# Patient Record
Sex: Male | Born: 2000 | Race: Black or African American | Hispanic: No | Marital: Single | Smoking: Never smoker
Health system: Southern US, Community
[De-identification: ages and names within clinical notes are randomized; demographics above are authoritative.]

## PROBLEM LIST (undated history)

## (undated) ENCOUNTER — Ambulatory Visit (HOSPITAL_COMMUNITY)
Admission: RE | Disposition: A | Discharge status: Still In Hospital | Payer: BLUE CROSS/BLUE SHIELD | Source: Ambulatory Visit

## (undated) DIAGNOSIS — I10 Essential (primary) hypertension: Secondary | ICD-10-CM

## (undated) DIAGNOSIS — E109 Type 1 diabetes mellitus without complications: Secondary | ICD-10-CM

---
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
## 2018-01-01 LAB — URINE CULTURE: Culture: NO GROWTH

## 2018-01-05 ENCOUNTER — Encounter (HOSPITAL_COMMUNITY): Payer: Self-pay

## 2018-01-05 ENCOUNTER — Ambulatory Visit (HOSPITAL_COMMUNITY)
Admission: EM | Admit: 2018-01-05 | Discharge: 2018-01-05 | Disposition: A | Discharge status: Home / Self Care | Payer: BLUE CROSS/BLUE SHIELD | Attending: Emergency Medicine | Admitting: Emergency Medicine

## 2018-01-05 DIAGNOSIS — I10 Essential (primary) hypertension: Secondary | ICD-10-CM

## 2018-01-05 DIAGNOSIS — Z113 Encounter for screening for infections with a predominantly sexual mode of transmission: Secondary | ICD-10-CM

## 2018-01-05 DIAGNOSIS — Z202 Contact with and (suspected) exposure to infections with a predominantly sexual mode of transmission: Secondary | ICD-10-CM | POA: Diagnosis not present

## 2018-01-05 DIAGNOSIS — R300 Dysuria: Secondary | ICD-10-CM | POA: Insufficient documentation

## 2018-01-05 DIAGNOSIS — Z711 Person with feared health complaint in whom no diagnosis is made: Secondary | ICD-10-CM | POA: Diagnosis present

## 2018-01-05 MED ORDER — CEFTRIAXONE SODIUM 250 MG IJ SOLR
INTRAMUSCULAR | Status: AC
  Filled 2018-01-05: qty 250

## 2018-01-05 MED ORDER — CEFTRIAXONE SODIUM 250 MG IJ SOLR
250.0000 mg | Freq: Once | INTRAMUSCULAR | Status: AC
  Administered 2018-01-05: 250 mg via INTRAMUSCULAR

## 2018-01-05 MED ORDER — AZITHROMYCIN 250 MG PO TABS
ORAL_TABLET | ORAL | Status: AC
  Filled 2018-01-05: qty 4

## 2018-01-05 MED ORDER — LIDOCAINE HCL (PF) 1 % IJ SOLN
INTRAMUSCULAR | Status: AC
  Filled 2018-01-05: qty 2

## 2018-01-05 MED ORDER — AZITHROMYCIN 250 MG PO TABS
1000.0000 mg | ORAL_TABLET | Freq: Once | ORAL | Status: AC
  Administered 2018-01-05: 1000 mg via ORAL

## 2018-01-05 NOTE — ED Provider Notes (Signed)
[facility]   [id number] 01/05/18 Arrival Time: 1623   CC: Dysuria  SUBJECTIVE:  Glen Merritt is a 18 y.o. male hx significant for DM type 1, HTN, who presents requesting STI screening.  Patient complains of dysuria x 1 weeks.  Last unprotected sexual encounter 1 month ago.  Has been sexually active with 2 male partners within the last 6 months.  Denies similar symptoms in the past.  Denies fever, chills, nausea, vomiting, abdominal or pelvic pain, penile rashes or lesions, testicular swelling or pain.      No LMP for male patient.  ROS: As per HPI.  Past Medical History:  Diagnosis Date  . Hypertension   . Type 1 diabetes mellitus ([place])    Dx 10/2003.  On a pump in the past   History reviewed. No pertinent surgical history. No Known Allergies No current facility-administered medications on file prior to encounter.    Current Outpatient Medications on File Prior to Encounter  Medication Sig Dispense Refill  . Blood Glucose Monitoring Suppl (ACCU-CHEK GUIDE) w/Device KIT 1 Device by Does not apply route daily as needed. 1 kit 0  . Continuous Blood Gluc Receiver (FREESTYLE LIBRE 14 DAY READER) DEVI 1 DEVICE BY DOES NOT APPLY ROUTE AS NEEDED. 1 Device 0  . Continuous Blood Gluc Sensor (FREESTYLE LIBRE SENSOR SYSTEM) MISC 1 Device by Does not apply route every 14 (fourteen) days. 6 each 3  . glucagon 1 MG injection Use for Severe Hypoglycemia . Inject 6m intramuscularly if unresponsive, unable to swallow, unconscious and/or has seizure. 2 kit 2  . glucose blood (ACCU-CHEK GUIDE) test strip Used to check blood sugars four times daily. 200 each 11  . glucose blood (CONTOUR NEXT TEST) test strip TEST BLOOD SUGAR 4 TIMES DAILY 360 each 3  . insulin aspart (NOVOLOG FLEXPEN) 100 UNIT/ML FlexPen As directed, total of 50 units per day, and pen needles 4/day 35 pen 11  . Insulin Glargine (LANTUS SOLOSTAR) 100 UNIT/ML Solostar Pen Inject 80 Units into the skin every morning. 10  pen 6  . Lancets Misc. (ACCU-CHEK MULTICLIX LANCET DEV) KIT Used to check blood sugars four times daily. 200 each 11  . lisinopril (PRINIVIL,ZESTRIL) 5 MG tablet TAKE 1 TABLET (5 MG TOTAL) BY MOUTH DAILY. 90 tablet 3   Social History   Socioeconomic History  . Marital status: Single    Spouse name: Not on file  . Number of children: Not on file  . Years of education: Not on file  . Highest education level: Not on file  Occupational History  . Not on file  Social Needs  . Financial resource strain: Not on file  . Food insecurity:    Worry: Not on file    Inability: Not on file  . Transportation needs:    Medical: Not on file    Non-medical: Not on file  Tobacco Use  . Smoking status: Never Smoker  . Smokeless tobacco: Never Used  Substance and Sexual Activity  . Alcohol use: No  . Drug use: Not on file  . Sexual activity: Not on file  Lifestyle  . Physical activity:    Days per week: Not on file    Minutes per session: Not on file  . Stress: Not on file  Relationships  . Social connections:    Talks on phone: Not on file    Gets together: Not on file    Attends religious service: Not on file    Active member of  club or organization: Not on file    Attends meetings of clubs or organizations: Not on file    Relationship status: Not on file  . Intimate partner violence:    Fear of current or ex partner: Not on file    Emotionally abused: Not on file    Physically abused: Not on file    Forced sexual activity: Not on file  Other Topics Concern  . Not on file  Social History Narrative   Is in 11th grade at [employer]. Lives with Mother and 2 brothers - ages 70,20; [place] pigs in the house; No smokers in the house.   Family History  Problem Relation Age of Onset  . Heart disease Maternal Grandmother   . Heart disease Maternal Grandfather   . Cancer Maternal Grandfather   . Obesity Mother   . Diabetes Mother   . Hypertension Mother     OBJECTIVE:  Vitals:   01/05/18  1653  BP: (!) 141/88  Pulse: (!) 122  Resp: 20  Temp: 98.4 F (36.9 C)  TempSrc: Oral  SpO2: 98%     General appearance: alert, NAD, appears stated age Head: NCAT Throat: lips, mucosa, and tongue normal; teeth and gums normal Lungs: CTA bilaterally without adventitious breath sounds Heart: regular rate and rhythm.  Radial pulses 2+ symmetrical bilaterally Back: no CVA tenderness Abdomen: soft, non-tender; bowel sounds normal;  no guarding GU: deferred Skin: warm and dry Psychological:  Alert and cooperative. Anxious mood and affect.  LABS:  Results for orders placed or performed during the hospital encounter of 12/31/17  Urine culture  Result Value Ref Range   Specimen Description URINE, RANDOM    Special Requests NONE    Culture      NO GROWTH Performed at [facility], [place] [address] [place], [place] [address]    Report Status 01/01/2018 FINAL   POCT urinalysis dip (device)  Result Value Ref Range   Glucose, UA 500 (A) NEGATIVE mg/dL   Bilirubin Urine NEGATIVE NEGATIVE   Ketones, ur 80 (A) NEGATIVE mg/dL   Specific Gravity, Urine 1.010 1.005 - 1.030   Hgb urine dipstick TRACE (A) NEGATIVE   pH 6.0 5.0 - 8.0   Protein, ur NEGATIVE NEGATIVE mg/dL   Urobilinogen, UA 0.2 0.0 - 1.0 mg/dL   Nitrite NEGATIVE NEGATIVE   Leukocytes, UA TRACE (A) NEGATIVE  I-STAT, chem 8  Result Value Ref Range   Sodium 135 135 - 145 mmol/L   Potassium 3.6 3.5 - 5.1 mmol/L   Chloride 102 98 - 111 mmol/L   BUN 16 6 - 20 mg/dL   Creatinine, Ser 0.60 (L) 0.61 - 1.24 mg/dL   Glucose, Bld 228 (H) 70 - 99 mg/dL   Calcium, Ion 1.20 1.15 - 1.40 mmol/L   TCO2 18 (L) 22 - 32 mmol/L   Hemoglobin 16.7 13.0 - 17.0 g/dL   HCT 49.0 39.0 - 52.0 %  Infectious mono screen, POC  Result Value Ref Range   Mono Screen NEGATIVE NEGATIVE    Labs Reviewed  URINE CYTOLOGY ANCILLARY ONLY    ASSESSMENT & PLAN:  1. Dysuria   2. Concern about STD in male without diagnosis     Meds ordered  this encounter  Medications  . cefTRIAXone (ROCEPHIN) injection 250 mg  . azithromycin (ZITHROMAX) tablet 1,000 mg    Pending: Labs Reviewed  URINE CYTOLOGY ANCILLARY ONLY    Given rocephin 257m injection and azithromycin 1g in office Urine cytology obtained  HIV/  syphilis testing today We will follow up with you regarding the results of your test If tests are positive, please abstain from sexual activity for at least 7 days and notify partners Follow up with PCP if symptoms persist Return here or go to ER if you have any new or worsening symptoms fever, chills, nausea, vomiting, abdominal pain, etc...  Reviewed expectations re: course of current medical issues. Questions answered. Outlined signs and symptoms indicating need for more acute intervention. Patient verbalized understanding. After Visit Summary given.       Lestine Box, PA-C 01/05/18 1958

## 2018-01-05 NOTE — Discharge Instructions (Addendum)
Given rocephin 250mg  injection and azithromycin 1g in office Urine cytology obtained  HIV/ syphilis testing today We will follow up with you regarding the results of your test If tests are positive, please abstain from sexual activity for at least 7 days and notify partners Follow up with PCP if symptoms persist Return here or go to ER if you have any new or worsening symptoms fever, chills, nausea, vomiting, abdominal pain, etc..Marland Kitchen

## 2018-01-05 NOTE — ED Triage Notes (Signed)
Pt presents to get STD Testing today.

## 2018-01-06 LAB — URINE CYTOLOGY ANCILLARY ONLY
CHLAMYDIA, DNA PROBE: POSITIVE — AB
Neisseria Gonorrhea: POSITIVE — AB
Trichomonas: NEGATIVE

## 2018-01-06 LAB — HIV ANTIBODY (ROUTINE TESTING W REFLEX): HIV Screen 4th Generation wRfx: NONREACTIVE

## 2018-01-06 LAB — RPR: RPR Ser Ql: NONREACTIVE

## 2018-01-08 ENCOUNTER — Telehealth (HOSPITAL_COMMUNITY): Payer: Self-pay | Admitting: Emergency Medicine

## 2018-01-08 ENCOUNTER — Encounter (HOSPITAL_COMMUNITY): Payer: Self-pay

## 2018-01-08 NOTE — Telephone Encounter (Signed)
Chlamydia is positive.  This was treated at the urgent care visit with po zithromax 1g.  Pt needs education to please refrain from sexual intercourse for 7 days to give the medicine time to work.  Sexual partners need to be notified and tested/treated.  Condoms may reduce risk of reinfection.  Recheck or followup with PCP for further evaluation if symptoms are not improving.  [facility] notified.  Test for gonorrhea was positive. This was treated at the urgent care visit with IM rocephin 250mg and po zithromax 1g. Pt needs education to refrain from sexual intercourse for 7 days after treatment to give the medicine time to work. Sexual partners need to be notified and tested/treated. Condoms may reduce risk of reinfection. Recheck or followup with PCP for further evaluation if symptoms are not improving. [facility] notified.   Attempted to reach patient. No answer at this time. Voicemail left.    

## 2018-01-12 ENCOUNTER — Telehealth (HOSPITAL_COMMUNITY): Payer: Self-pay | Admitting: Emergency Medicine

## 2018-01-12 NOTE — Telephone Encounter (Signed)
Patient contacted and made aware, all questions answered.   

## 2018-06-08 ENCOUNTER — Other Ambulatory Visit: Payer: Self-pay | Admitting: Endocrinology

## 2018-06-08 DIAGNOSIS — IMO0001 Reserved for inherently not codable concepts without codable children: Secondary | ICD-10-CM

## 2018-06-08 NOTE — Telephone Encounter (Signed)
Please refill x 1 Further refills would have to be considered by new PCP  

## 2018-06-14 ENCOUNTER — Other Ambulatory Visit: Payer: Self-pay | Admitting: Endocrinology

## 2018-06-14 DIAGNOSIS — E10649 Type 1 diabetes mellitus with hypoglycemia without coma: Secondary | ICD-10-CM

## 2018-06-16 NOTE — Telephone Encounter (Signed)
Please refill x 1 F/u is due  

## 2018-06-17 ENCOUNTER — Other Ambulatory Visit: Payer: Self-pay

## 2018-06-17 DIAGNOSIS — E1010 Type 1 diabetes mellitus with ketoacidosis without coma: Secondary | ICD-10-CM

## 2018-06-17 MED ORDER — INSULIN PEN NEEDLE 31G X 8 MM MISC: 1.0000 | Freq: Two times a day (BID) | 0 refills | Status: DC

## 2018-07-11 ENCOUNTER — Other Ambulatory Visit: Payer: Self-pay

## 2018-07-11 ENCOUNTER — Encounter: Payer: Self-pay | Admitting: Endocrinology

## 2018-07-11 ENCOUNTER — Ambulatory Visit (INDEPENDENT_AMBULATORY_CARE_PROVIDER_SITE_OTHER): Payer: BC Managed Care – PPO | Admitting: Endocrinology

## 2018-07-11 VITALS — BP 144/88 | HR 89 | Ht 68.5 in | Wt 171.8 lb

## 2018-07-11 DIAGNOSIS — E109 Type 1 diabetes mellitus without complications: Secondary | ICD-10-CM

## 2018-07-11 LAB — POCT GLYCOSYLATED HEMOGLOBIN (HGB A1C): Hemoglobin A1C: 9.6 % — AB (ref 4.0–5.6)

## 2018-07-11 MED ORDER — ACCU-CHEK GUIDE W/DEVICE KIT: 1.0000 | PACK | Freq: Four times a day (QID) | 0 refills | Status: DC

## 2018-07-11 MED ORDER — TRESIBA FLEXTOUCH 200 UNIT/ML ~~LOC~~ SOPN: 80.0000 [IU] | PEN_INJECTOR | Freq: Every day | SUBCUTANEOUS | 3 refills | Status: DC

## 2018-07-11 NOTE — Patient Instructions (Addendum)
Your blood pressure is high today.  Please see a primary care provider soon, to have it rechecked check your blood sugar 4 times a day: before the 3 meals, and at bedtime.  also check if you have symptoms of your blood sugar being too high or too low.  please keep a record of the readings and bring it to your next appointment here (or you can bring the meter itself).  You can write it on any piece of paper.  please call us sooner if your blood sugar goes below 70, or if you have a lot of readings over 200. Please change the lantus to "Tresiba," 80 units per day.  This is a very slow-acting insulin.  Therefore, if you miss it, you can take it when you are able.  Please continue the same as-needed novolog (however, I hope you will need less on the [place]).   Please see Vaughan Basta, to consider a pump. Please come back for a follow-up appointment in 2 months.

## 2018-07-11 NOTE — Progress Notes (Signed)
Subjective:    Patient ID: Glen Merritt, male    DOB: July 27, 2000, 18 y.o.   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  Interval history: Pt says he sometimes misses the insulin, as he is very busy at work.  He has mild hypoglycemia at least once per day. This happens with activity, or with missing a meal.  Otherwise, pt states he feels well in general.  He averages 80-90 units of Novolog per day.  He wants to consider a pump.   Past Medical History:  Diagnosis Date  . Hypertension   . Type 1 diabetes mellitus ([place])    Dx 10/2003.  On a pump in the past    No past surgical history on file.  Social History   Socioeconomic History  . Marital status: Single    Spouse name: Not on file  . Number of children: Not on file  . Years of education: Not on file  . Highest education level: Not on file  Occupational History  . Not on file  Social Needs  . Financial resource strain: Not on file  . Food insecurity    Worry: Not on file    Inability: Not on file  . Transportation needs    Medical: Not on file    Non-medical: Not on file  Tobacco Use  . Smoking status: Never Smoker  . Smokeless tobacco: Never Used  Substance and Sexual Activity  . Alcohol use: No  . Drug use: Not on file  . Sexual activity: Not on file  Lifestyle  . Physical activity    Days per week: Not on file    Minutes per session: Not on file  . Stress: Not on file  Relationships  . Social Herbalist on phone: Not on file    Gets together: Not on file    Attends religious service: Not on file    Active member of  club or organization: Not on file    Attends meetings of clubs or organizations: Not on file    Relationship status: Not on file  . Intimate partner violence    Fear of current or ex partner: Not on file    Emotionally abused: Not on file    Physically abused: Not on file    Forced sexual activity: Not on file  Other Topics Concern  . Not on file  Social History Narrative   Is in 11th grade at [employer]. Lives with Mother and 2 brothers - ages 40,20; [place] pigs in the house; No smokers in the house.    Current Outpatient Medications on File Prior to Visit  Medication Sig Dispense Refill  . glucagon 1 MG injection Use for Severe Hypoglycemia . Inject 17m intramuscularly if unresponsive, unable to swallow, unconscious and/or has seizure. 2 kit 2  . glucose blood (ACCU-CHEK GUIDE) test strip Used to check blood sugars four times daily. 200 each 11  . insulin aspart (NOVOLOG FLEXPEN) 100 UNIT/ML FlexPen Inject 100 units per day 30 mL 0  . Insulin Pen Needle 31G X 8 MM MISC 1 each by Does not apply route  2 (two) times a day. 90 each 0  . Lancets Misc. (ACCU-CHEK MULTICLIX LANCET DEV) KIT Used to check blood sugars four times daily. 200 each 11  . lisinopril (ZESTRIL) 5 MG tablet TAKE 1 TABLET BY MOUTH EVERY DAY **All refills should come from PCP.** 90 tablet 0   No current facility-administered medications on file prior to visit.     No Known Allergies  Family History  Problem Relation Age of Onset  . Heart disease Maternal Grandmother   . Heart disease Maternal Grandfather   . Cancer Maternal Grandfather   . Obesity Mother   . Diabetes Mother   . Hypertension Mother     BP (!) 144/88 (BP Location: Left Arm, Patient Position: Sitting, Cuff Size: Normal)   Pulse 89   Ht 5' 8.5" (1.74 m)   Wt 171 lb 12.8 oz (77.9 kg)   SpO2 96%   BMI 25.74 kg/m    Review of Systems He denies LOC.      Objective:   Physical Exam VITAL SIGNS:  See vs page GENERAL: no distress Pulses: dorsalis  pedis intact bilat.   MSK: no deformity of the feet CV: no leg edema Skin:  no ulcer on the feet.  normal color and temp on the feet. Neuro: sensation is intact to touch on the feet   Lab Results  Component Value Date   HGBA1C 9.6 (A) 07/11/2018       Assessment & Plan:  Type 1 DM: worse.  Hypoglycemia: this limits aggressiveness of glycemic control Noncompliance with insulin: tresiba offers dosing flexibility. HTN: is noted today.    Patient Instructions  Your blood pressure is high today.  Please see a primary care provider soon, to have it rechecked check your blood sugar 4 times a day: before the 3 meals, and at bedtime.  also check if you have symptoms of your blood sugar being too high or too low.  please keep a record of the readings and bring it to your next appointment here (or you can bring the meter itself).  You can write it on any piece of paper.  please call us sooner if your blood sugar goes below 70, or if you have a lot of readings over 200. Please change the lantus to "Tresiba," 80 units per day.  This is a very slow-acting insulin.  Therefore, if you miss it, you can take it when you are able.  Please continue the same as-needed novolog (however, I hope you will need less on the [place]).   Please see Vaughan Basta, to consider a pump. Please come back for a follow-up appointment in 2 months.

## 2018-07-14 ENCOUNTER — Other Ambulatory Visit: Payer: Self-pay | Admitting: Endocrinology

## 2018-07-14 DIAGNOSIS — E10649 Type 1 diabetes mellitus with hypoglycemia without coma: Secondary | ICD-10-CM

## 2018-07-14 NOTE — Telephone Encounter (Signed)
Patients mom called to inform he is completely out of this medication.

## 2018-07-17 ENCOUNTER — Other Ambulatory Visit: Payer: Self-pay | Admitting: Endocrinology

## 2018-07-17 ENCOUNTER — Telehealth: Payer: Self-pay | Admitting: Endocrinology

## 2018-07-17 DIAGNOSIS — E10649 Type 1 diabetes mellitus with hypoglycemia without coma: Secondary | ICD-10-CM

## 2018-07-17 NOTE — Telephone Encounter (Signed)
Patient is requesting that we switch his 30 day supply of medication to 90 days because it is covered.  Please Advise,Thanks

## 2018-07-18 ENCOUNTER — Other Ambulatory Visit: Payer: Self-pay

## 2018-07-18 DIAGNOSIS — E1010 Type 1 diabetes mellitus with ketoacidosis without coma: Secondary | ICD-10-CM

## 2018-07-18 DIAGNOSIS — E10649 Type 1 diabetes mellitus with hypoglycemia without coma: Secondary | ICD-10-CM

## 2018-07-18 MED ORDER — TRESIBA FLEXTOUCH 200 UNIT/ML ~~LOC~~ SOPN: 80.0000 [IU] | PEN_INJECTOR | Freq: Every day | SUBCUTANEOUS | 99 refills | Status: DC

## 2018-07-18 MED ORDER — INSULIN PEN NEEDLE 31G X 8 MM MISC: 1.0000 | Freq: Two times a day (BID) | 99 refills | Status: DC

## 2018-07-18 MED ORDER — NOVOLOG FLEXPEN 100 UNIT/ML ~~LOC~~ SOPN: PEN_INJECTOR | SUBCUTANEOUS | 99 refills | Status: DC

## 2018-07-18 NOTE — Telephone Encounter (Signed)
Please advise 

## 2018-07-18 NOTE — Telephone Encounter (Signed)
All Rx's sent per Dr. Cordelia Pen orders

## 2018-07-18 NOTE — Telephone Encounter (Signed)
Please refill both insulins x 90 days, with PRN refills.

## 2018-08-31 ENCOUNTER — Other Ambulatory Visit: Payer: Self-pay | Admitting: Endocrinology

## 2018-08-31 DIAGNOSIS — IMO0001 Reserved for inherently not codable concepts without codable children: Secondary | ICD-10-CM

## 2018-08-31 NOTE — Telephone Encounter (Signed)
Please refill x 1 month. Further refills would have to be considered by a new PCP

## 2018-09-12 ENCOUNTER — Ambulatory Visit (INDEPENDENT_AMBULATORY_CARE_PROVIDER_SITE_OTHER): Payer: BC Managed Care – PPO | Admitting: Endocrinology

## 2018-09-12 ENCOUNTER — Encounter: Payer: Self-pay | Admitting: Endocrinology

## 2018-09-12 ENCOUNTER — Other Ambulatory Visit: Payer: Self-pay

## 2018-09-12 VITALS — BP 132/70 | HR 85 | Ht 68.5 in | Wt 177.0 lb

## 2018-09-12 DIAGNOSIS — E10649 Type 1 diabetes mellitus with hypoglycemia without coma: Secondary | ICD-10-CM

## 2018-09-12 DIAGNOSIS — E1010 Type 1 diabetes mellitus with ketoacidosis without coma: Secondary | ICD-10-CM

## 2018-09-12 LAB — POCT GLYCOSYLATED HEMOGLOBIN (HGB A1C): Hemoglobin A1C: 10.1 % — AB (ref 4.0–5.6)

## 2018-09-12 NOTE — Patient Instructions (Addendum)
check your blood sugar 4 times a day: before the 3 meals, and at bedtime.  also check if you have symptoms of your blood sugar being too high or too low.  please keep a record of the readings and bring it to your next appointment here (or you can bring the meter itself).  You can write it on any piece of paper.  please call us sooner if your blood sugar goes below 70, or if you have a lot of readings over 200.  A different type of diabetes blood test is requested for you today.  We'll let you know about the result. If it is high, we'll increase the [place], to reduce you need for the Novolog.  I'll also refill it x 3 months.  I'll also prescribe for you a new meter.  Please come back for a follow-up appointment in 2 months.

## 2018-09-12 NOTE — Progress Notes (Signed)
Subjective:    Patient ID: Glen Merritt, male    DOB: 07-31-2000, 18 y.o.   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   Interval history: Pt says he does not miss the [place].  He has mild hypoglycemia approx once per day.  no cbg record, but states cbg's vary from 90-140.  He averages approx 50 units of novolog per day.   Past Medical History:  Diagnosis Date  . Hypertension   . Type 1 diabetes mellitus ([place])    Dx 10/2003.  On a pump in the past    No past surgical history on file.  Social History   Socioeconomic History  . Marital status: Single    Spouse name: Not on file  . Number of children: Not on file  . Years of education: Not on file  . Highest education level: Not on file  Occupational History  . Not on file  Social Needs  . Financial resource strain: Not on file  . Food insecurity    Worry: Not on file    Inability: Not on file  . Transportation needs    Medical: Not on file    Non-medical: Not on file  Tobacco Use  . Smoking status: Never Smoker  . Smokeless tobacco: Never Used  Substance and Sexual Activity  . Alcohol use: No  . Drug use: Not on file  . Sexual activity: Not on file  Lifestyle  . Physical activity    Days per week: Not on file    Minutes per session: Not on file  . Stress: Not on file  Relationships  . Social Herbalist on phone: Not on file    Gets together: Not on file    Attends religious service: Not on file    Active member of club or organization: Not on file    Attends  meetings of clubs or organizations: Not on file    Relationship status: Not on file  . Intimate partner violence    Fear of current or ex partner: Not on file    Emotionally abused: Not on file    Physically abused: Not on file    Forced sexual activity: Not on file  Other Topics Concern  . Not on file  Social History Narrative   Is in 11th grade at [employer]. Lives with Mother and 2 brothers - ages 60,20; [place] pigs in the house; No smokers in the house.    Current Outpatient Medications on File Prior to Visit  Medication Sig Dispense Refill  . Blood Glucose Monitoring Suppl (ACCU-CHEK GUIDE) w/Device KIT 1 Device by Does not apply route 4 (four) times daily. 1 kit 0  . glucagon 1 MG injection Use for Severe Hypoglycemia . Inject 40m intramuscularly if unresponsive, unable to swallow, unconscious and/or has seizure. 2 kit 2  . glucose blood (ACCU-CHEK GUIDE) test strip Used to check blood sugars four times daily. 200 each 11  . insulin aspart (NOVOLOG FLEXPEN) 100 UNIT/ML FlexPen Inject 100 units in the skin once per day  90 mL prn  . Insulin Degludec (TRESIBA FLEXTOUCH) 200 UNIT/ML SOPN Inject 80 Units into the skin daily. 72 mL prn  . Insulin Pen Needle 31G X 8 MM MISC 1 each by Does not apply route 2 (two) times a day. 1000 each prn  . Lancets Misc. (ACCU-CHEK MULTICLIX LANCET DEV) KIT Used to check blood sugars four times daily. 200 each 11  . lisinopril (ZESTRIL) 5 MG tablet TAKE 1 TABLET BY MOUTH EVERY DAY **ALL REFILLS SHOULD COME FROM PCP.** 30 tablet 0   No current facility-administered medications on file prior to visit.     No Known Allergies  Family History  Problem Relation Age of Onset  . Heart disease Maternal Grandmother   . Heart disease Maternal Grandfather   . Cancer Maternal Grandfather   . Obesity Mother   . Diabetes Mother   . Hypertension Mother     BP 132/70 (BP Location: Left Arm, Patient Position: Sitting, Cuff Size: Normal)   Pulse 85   Ht 5' 8.5" (1.74  m)   Wt 177 lb (80.3 kg)   SpO2 98%   BMI 26.52 kg/m    Review of Systems Denies LOC.     Objective:   Physical Exam VITAL SIGNS:  See vs page GENERAL: no distress Pulses: dorsalis pedis intact bilat.   MSK: no deformity of the feet CV: no leg edema Skin:  no ulcer on the feet.  normal color and temp on the feet. Neuro: sensation is intact to touch on the feet.    A1c=10.1%      Assessment & Plan:  Type 1 DM: worse.  In particular, a1c is much worse than would be predicted by cbg's.   Hypoglycemia: this limits aggressiveness of glycemic control  Patient Instructions  check your blood sugar 4 times a day: before the 3 meals, and at bedtime.  also check if you have symptoms of your blood sugar being too high or too low.  please keep a record of the readings and bring it to your next appointment here (or you can bring the meter itself).  You can write it on any piece of paper.  please call us sooner if your blood sugar goes below 70, or if you have a lot of readings over 200.  A different type of diabetes blood test is requested for you today.  We'll let you know about the result. If it is high, we'll increase the [place], to reduce you need for the Novolog.  I'll also refill it x 3 months.  I'll also prescribe for you a new meter.  Please come back for a follow-up appointment in 2 months.

## 2018-09-28 ENCOUNTER — Other Ambulatory Visit: Payer: Self-pay | Admitting: Endocrinology

## 2018-09-28 DIAGNOSIS — E1010 Type 1 diabetes mellitus with ketoacidosis without coma: Secondary | ICD-10-CM

## 2018-10-01 ENCOUNTER — Other Ambulatory Visit: Payer: Self-pay | Admitting: Endocrinology

## 2018-10-01 DIAGNOSIS — IMO0001 Reserved for inherently not codable concepts without codable children: Secondary | ICD-10-CM

## 2018-10-01 NOTE — Telephone Encounter (Signed)
Please refill x 3 months Further refills would have to be considered by new PCP   

## 2018-10-01 NOTE — Telephone Encounter (Signed)
Please advise 

## 2018-11-14 ENCOUNTER — Other Ambulatory Visit: Payer: Self-pay

## 2018-11-14 ENCOUNTER — Ambulatory Visit (INDEPENDENT_AMBULATORY_CARE_PROVIDER_SITE_OTHER): Payer: BC Managed Care – PPO | Admitting: Endocrinology

## 2018-11-14 ENCOUNTER — Encounter: Payer: Self-pay | Admitting: Endocrinology

## 2018-11-14 VITALS — BP 130/62 | HR 94 | Ht 68.5 in | Wt 171.0 lb

## 2018-11-14 DIAGNOSIS — E1010 Type 1 diabetes mellitus with ketoacidosis without coma: Secondary | ICD-10-CM

## 2018-11-14 DIAGNOSIS — R253 Fasciculation: Secondary | ICD-10-CM | POA: Diagnosis not present

## 2018-11-14 DIAGNOSIS — E109 Type 1 diabetes mellitus without complications: Secondary | ICD-10-CM | POA: Diagnosis not present

## 2018-11-14 LAB — POCT GLYCOSYLATED HEMOGLOBIN (HGB A1C): Hemoglobin A1C: 11.4 % — AB (ref 4.0–5.6)

## 2018-11-14 LAB — BASIC METABOLIC PANEL
BUN: 9 mg/dL (ref 6–23)
CO2: 26 mEq/L (ref 19–32)
Calcium: 9.2 mg/dL (ref 8.4–10.5)
Chloride: 102 mEq/L (ref 96–112)
Creatinine, Ser: 0.8 mg/dL (ref 0.40–1.50)
GFR: 148.24 mL/min (ref >60.00)
Glucose, Bld: 313 mg/dL — ABNORMAL HIGH (ref 70–99)
Potassium: 4.5 mEq/L (ref 3.5–5.1)
Sodium: 135 mEq/L (ref 135–145)

## 2018-11-14 LAB — TSH: TSH: 1.61 u[IU]/mL (ref 0.35–5.50)

## 2018-11-14 NOTE — Progress Notes (Addendum)
Subjective:    Patient ID: Glen Merritt, male    DOB: 2000-10-21, 18 y.o.   MRN: [id number]  HPI Pt returns for f/u of diabetes mellitus: DM type: 1 Dx'ed: 2119 Complications: none Therapy: insulin since dx DKA: Last episode was in 2016 Severe hypoglycemia: Last episode was in 2010 Pancreatitis: never Pancreatic imaging: never.   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   Interval history: Pt says he does not miss the [place].  He takes 80 units qd.  He also takes Novolog, for a total of 30 units per day.  He has mild hypoglycemia approx QOD.  no cbg record, but states cbg's vary from 53-150.   Past Medical History:  Diagnosis Date  . Hypertension   . Type 1 diabetes mellitus ([place])    Dx 10/2003.  On a pump in the past    No past surgical history on file.  Social History   Socioeconomic History  . Marital status: Single    Spouse name: Not on file  . Number of children: Not on file  . Years of education: Not on file  . Highest education level: Not on file  Occupational History  . Not on file  Social Needs  . Financial resource strain: Not on file  . Food insecurity    Worry: Not on file    Inability: Not on file  . Transportation needs    Medical: Not on file    Non-medical: Not on file  Tobacco Use  . Smoking status: Never Smoker  . Smokeless tobacco: Never Used  Substance and Sexual Activity  . Alcohol use: No  . Drug use: Not on file  . Sexual activity: Not on file  Lifestyle  . Physical activity    Days per week: Not on file    Minutes per session: Not on file  . Stress: Not on file  Relationships  . Social Herbalist on phone: Not on file    Gets together: Not on file    Attends religious service: Not on file    Active member of club or organization: Not  on file    Attends meetings of clubs or organizations: Not on file    Relationship status: Not on file  . Intimate partner violence    Fear of current or ex partner: Not on file    Emotionally abused: Not on file    Physically abused: Not on file    Forced sexual activity: Not on file  Other Topics Concern  . Not on file  Social History Narrative   Is in 11th grade at [employer]. Lives with Mother and 2 brothers - ages 53,20; [place] pigs in the house; No smokers in the house.    Current Outpatient Medications on File Prior to Visit  Medication Sig Dispense Refill  . B-D ULTRAFINE III SHORT PEN 31G X 8 MM MISC USE AS DIRECTED TWICE A DAY 180 each 3  . Blood Glucose Monitoring Suppl (ACCU-CHEK GUIDE) w/Device KIT 1 Device by Does not apply route 4 (four) times daily. 1 kit 0  . glucagon 1 MG injection Use for Severe Hypoglycemia . Inject 90m intramuscularly if unresponsive, unable to swallow, unconscious and/or has seizure. 2 kit 2  . glucose blood (ACCU-CHEK GUIDE) test  strip Used to check blood sugars four times daily. 200 each 11  . insulin aspart (NOVOLOG FLEXPEN) 100 UNIT/ML FlexPen Inject 100 units in the skin once per day (Patient taking differently: Inject 30 Units into the skin daily. Inject 100 units in the skin once per day) 90 mL prn  . Insulin Degludec (TRESIBA FLEXTOUCH) 200 UNIT/ML SOPN Inject 80 Units into the skin daily. 72 mL prn  . Lancets Misc. (ACCU-CHEK MULTICLIX LANCET DEV) KIT Used to check blood sugars four times daily. 200 each 11  . lisinopril (ZESTRIL) 5 MG tablet Take 1 tablet (5 mg total) by mouth daily. SEND FUTURE REFILL REQUESTS TO NEW PCP. WILL ONLY PROVIDE 90 DAY SUPPLY 30 tablet 2   No current facility-administered medications on file prior to visit.     No Known Allergies  Family History  Problem Relation Age of Onset  . Heart disease Maternal Grandmother   . Heart disease Maternal Grandfather   . Cancer Maternal Grandfather   . Obesity Mother   .  Diabetes Mother   . Hypertension Mother     BP 130/62 (BP Location: Left Arm, Patient Position: Sitting, Cuff Size: Normal)   Pulse 94   Ht 5' 8.5" (1.74 m)   Wt 171 lb (77.6 kg)   SpO2 96%   BMI 25.62 kg/m    Review of Systems Denies LOC.  He reports intermittent twitching sensation of the neck and app 4 extremities.      Objective:   Physical Exam VITAL SIGNS:  See vs page.  GENERAL: no distress.  Pulses: dorsalis pedis intact bilat.   MSK: no deformity of the feet CV: no leg edema.  Skin:  no ulcer on the feet.  normal color and temp on the feet. Neuro: sensation is intact to touch on the feet.    Lab Results  Component Value Date   HGBA1C 11.4 (A) 11/14/2018       Assessment & Plan:  Type 1 DM: uncertain glycemic control Hypoglycemia: this limits aggressiveness of glycemic control' Discordance between cbg's and A1c.  check fructosamine. Twitching sensation: check labs for this, also.   Patient Instructions  check your blood sugar 4 times a day: before the 3 meals, and at bedtime.  also check if you have symptoms of your blood sugar being too high or too low.  please keep a record of the readings and bring it to your next appointment here (or you can bring the meter itself).  You can write it on any piece of paper.  please call us sooner if your blood sugar goes below 70, or if you have a lot of readings over 200.  A different type of diabetes blood test is requested for you today.  We'll let you know about the result.   If it is high, we'll increase the [place] again, to reduce you need for the Novolog.  I'll also refill it x 3 months.   To avoid the blood sugar being low in the middle of the night, you should eat a snack at bedtime.   Please come back for a follow-up appointment in 2 months.

## 2018-11-14 NOTE — Patient Instructions (Addendum)
check your blood sugar 4 times a day: before the 3 meals, and at bedtime.  also check if you have symptoms of your blood sugar being too high or too low.  please keep a record of the readings and bring it to your next appointment here (or you can bring the meter itself).  You can write it on any piece of paper.  please call us sooner if your blood sugar goes below 70, or if you have a lot of readings over 200.  A different type of diabetes blood test is requested for you today.  We'll let you know about the result.   If it is high, we'll increase the [place] again, to reduce you need for the Novolog.  I'll also refill it x 3 months.   To avoid the blood sugar being low in the middle of the night, you should eat a snack at bedtime.   Please come back for a follow-up appointment in 2 months.

## 2018-11-19 LAB — FRUCTOSAMINE: Fructosamine: 434 umol/L — ABNORMAL HIGH (ref 205–285)

## 2019-01-05 ENCOUNTER — Other Ambulatory Visit: Payer: Self-pay | Admitting: Endocrinology

## 2019-01-05 DIAGNOSIS — E1065 Type 1 diabetes mellitus with hyperglycemia: Secondary | ICD-10-CM

## 2019-01-05 NOTE — Telephone Encounter (Signed)
please contact patient: Who is your PCP.  If you don't have one, please set up appt. please refill x 1, pending that appt.

## 2019-01-05 NOTE — Telephone Encounter (Signed)
On 10/01/18 you requested Rx be refilled for a 90 day supply and pt to be advised to schedule with PCP. Do you still wish to refill x1?

## 2019-01-05 NOTE — Telephone Encounter (Signed)
Please advise 

## 2019-01-06 NOTE — Telephone Encounter (Signed)
We have tried refilling x 1, so the next step is to make sure he is getting set up with a primary care provider.

## 2019-01-16 ENCOUNTER — Encounter: Payer: Self-pay | Admitting: Endocrinology

## 2019-01-16 ENCOUNTER — Ambulatory Visit (INDEPENDENT_AMBULATORY_CARE_PROVIDER_SITE_OTHER): Payer: BC Managed Care – PPO | Admitting: Endocrinology

## 2019-01-16 ENCOUNTER — Other Ambulatory Visit: Payer: Self-pay

## 2019-01-16 VITALS — BP 104/64 | HR 95 | Ht 68.5 in | Wt 165.0 lb

## 2019-01-16 DIAGNOSIS — E1010 Type 1 diabetes mellitus with ketoacidosis without coma: Secondary | ICD-10-CM

## 2019-01-16 LAB — POCT GLYCOSYLATED HEMOGLOBIN (HGB A1C): Hemoglobin A1C: 11.7 % — AB (ref 4.0–5.6)

## 2019-01-16 MED ORDER — TRESIBA FLEXTOUCH 200 UNIT/ML ~~LOC~~ SOPN: 120.0000 [IU] | PEN_INJECTOR | Freq: Every day | SUBCUTANEOUS | 99 refills | Status: DC

## 2019-01-16 NOTE — Patient Instructions (Addendum)
check your blood sugar 4 times a day: before the 3 meals, and at bedtime.  also check if you have symptoms of your blood sugar being too high or too low.  please keep a record of the readings and bring it to your next appointment here (or you can bring the meter itself).  You can write it on any piece of paper.  please call us sooner if your blood sugar goes below 70, or if you have a lot of readings over 200.  I have sent a prescription to your pharmacy, to increase the Tresiba to 120 units daily.  This will help you further reduce the novolog.  On this type of insulin schedule, you should eat meals on a regular schedule.  If a meal is missed or significantly delayed, your blood sugar could go low.  To avoid the blood sugar being low in the middle of the night, you should eat a snack at bedtime.   Please come back for a follow-up appointment in 2 months.

## 2019-01-16 NOTE — Progress Notes (Signed)
Subjective:    Patient ID: Glen Merritt, male    DOB: Aug 19, 2000, 19 y.o.   MRN: [id number]  HPI Pt returns for f/u of diabetes mellitus: DM type: 1 Dx'ed: 4235 Complications: none Therapy: insulin since dx DKA: Last episode was in 2016 Severe hypoglycemia: Last episode was in 2010 Pancreatitis: never Pancreatic imaging: never.   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   SDOH: he was ref to consider pump, but he did not make appt; he eats 1 meal per day (supper).   Interval history: Pt says he never missed the [place].  He takes 100 units qd.  He also takes Novolog, for a total of 50 units per day.  He has mild hypoglycemia approx QOD.  no cbg record, but states cbg's vary from 60-500. It is in general lowest in the afternoon.  Past Medical History:  Diagnosis Date  . Hypertension   . Type 1 diabetes mellitus ([place])    Dx 10/2003.  On a pump in the past    No past surgical history on file.  Social History   Socioeconomic History  . Marital status: Single    Spouse name: Not on file  . Number of children: Not on file  . Years of education: Not on file  . Highest education level: Not on file  Occupational History  . Not on file  Tobacco Use  . Smoking status: Never Smoker  . Smokeless tobacco: Never Used  Substance and Sexual Activity  . Alcohol use: No  . Drug use: Not on file  . Sexual activity: Not on file  Other Topics Concern  . Not on file  Social History Narrative   Is in 11th grade at [employer]. Lives with Mother and 2 brothers - ages 72,20; [place] pigs in the house; No smokers in the house.   Social Determinants of Health   Financial Resource Strain:   . Difficulty of Paying Living Expenses: Not on file  Food Insecurity:   . Worried About Charity fundraiser in the Last Year: Not on file  . Ran Out of Food in the Last Year: Not  on file  Transportation Needs:   . Lack of Transportation (Medical): Not on file  . Lack of Transportation (Non-Medical): Not on file  Physical Activity:   . Days of Exercise per Week: Not on file  . Minutes of Exercise per Session: Not on file  Stress:   . Feeling of Stress : Not on file  Social Connections:   . Frequency of Communication with Friends and Family: Not on file  . Frequency of Social Gatherings with Friends and Family: Not on file  . Attends Religious Services: Not on file  . Active Member of Clubs or Organizations: Not on file  . Attends Archivist Meetings: Not on file  . Marital Status: Not on file  Intimate Partner Violence:   . Fear of Current or Ex-Partner: Not on file  . Emotionally Abused: Not on file  . Physically Abused: Not on file  . Sexually Abused: Not on file    Current Outpatient Medications on File Prior to Visit  Medication Sig Dispense Refill  . B-D ULTRAFINE III SHORT PEN 31G X 8 MM MISC USE AS DIRECTED TWICE A DAY 180 each 3  . Blood Glucose Monitoring Suppl (ACCU-CHEK GUIDE) w/Device KIT 1  Device by Does not apply route 4 (four) times daily. 1 kit 0  . glucagon 1 MG injection Use for Severe Hypoglycemia . Inject 17m intramuscularly if unresponsive, unable to swallow, unconscious and/or has seizure. 2 kit 2  . glucose blood (ACCU-CHEK GUIDE) test strip Used to check blood sugars four times daily. 200 each 11  . insulin aspart (NOVOLOG FLEXPEN) 100 UNIT/ML FlexPen Inject 100 units in the skin once per day (Patient taking differently: Inject 50 Units into the skin daily. Inject 50 units in the skin once per day) 90 mL prn  . Lancets Misc. (ACCU-CHEK MULTICLIX LANCET DEV) KIT Used to check blood sugars four times daily. 200 each 11  . lisinopril (ZESTRIL) 5 MG tablet Take 1 tablet (5 mg total) by mouth daily. SEND FUTURE REFILL REQUESTS TO NEW PCP. WILL ONLY PROVIDE 90 DAY SUPPLY 30 tablet 2   No current facility-administered medications on  file prior to visit.    No Known Allergies  Family History  Problem Relation Age of Onset  . Heart disease Maternal Grandmother   . Heart disease Maternal Grandfather   . Cancer Maternal Grandfather   . Obesity Mother   . Diabetes Mother   . Hypertension Mother     BP 104/64 (BP Location: Right Arm, Patient Position: Sitting, Cuff Size: Normal)   Pulse 95   Ht 5' 8.5" (1.74 m)   Wt 165 lb (74.8 kg)   SpO2 94%   BMI 24.72 kg/m    Review of Systems Denies LOC    Objective:   Physical Exam VITAL SIGNS:  See vs page.  GENERAL: no distress Pulses: dorsalis pedis intact bilat.   MSK: no deformity of the feet CV: no leg edema.  Skin:  no ulcer on the feet.  normal color and temp on the feet.  Neuro: sensation is intact to touch on the feet.   Lab Results  Component Value Date   HGBA1C 11.7 (A) 01/16/2019        Assessment & Plan:  Type 1 DM: worse Hypoglycemia: this limits aggressiveness of glycemic control.  We'll try increasing Tresiba, and decrease Novolog  Patient Instructions  check your blood sugar 4 times a day: before the 3 meals, and at bedtime.  also check if you have symptoms of your blood sugar being too high or too low.  please keep a record of the readings and bring it to your next appointment here (or you can bring the meter itself).  You can write it on any piece of paper.  please call [email or web address] if your blood sugar goes below 70, or if you have a lot of readings over 200.  I have sent a prescription to your pharmacy, to increase the Tresiba to 120 units daily.  This will help you further reduce the novolog.  On this type of insulin schedule, you should eat meals on a regular schedule.  If a meal is missed or significantly delayed, your blood sugar could go low.  To avoid the blood sugar being low in the middle of the night, you should eat a snack at bedtime.   Please come back for a follow-up appointment in 2 months.

## 2019-02-06 ENCOUNTER — Other Ambulatory Visit: Payer: Self-pay

## 2019-02-06 ENCOUNTER — Telehealth: Payer: Self-pay | Admitting: Endocrinology

## 2019-02-06 DIAGNOSIS — E1010 Type 1 diabetes mellitus with ketoacidosis without coma: Secondary | ICD-10-CM

## 2019-02-06 MED ORDER — TRESIBA FLEXTOUCH 200 UNIT/ML ~~LOC~~ SOPN: 120.0000 [IU] | PEN_INJECTOR | Freq: Every day | SUBCUTANEOUS | 0 refills | Status: DC

## 2019-02-06 NOTE — Telephone Encounter (Signed)
Outpatient Medication Detail   Disp Refills Start End   Insulin Degludec (TRESIBA FLEXTOUCH) 200 UNIT/ML SOPN 54 mL 0 02/06/2019    Sig - Route: Inject 120 Units into the skin daily. - Subcutaneous   Sent to pharmacy as: Insulin Degludec (TRESIBA FLEXTOUCH) 200 UNIT/ML Solution Pen-injector   E-Prescribing Status: Receipt confirmed by pharmacy (02/06/2019  3:37 PM EST)

## 2019-02-06 NOTE — Telephone Encounter (Signed)
MEDICATION: [place]  PH[facility]ACY:  [facility] [address]  IS THIS A 90 DAY SUPPLY : Would Like A 90 day  IS PATIENT OUT OF MEDICATION: YES  IF NOT; HOW MUCH IS LEFT:   LAST APPOINTMENT DATE: @1 /15/2021  NEXT APPOINTMENT DATE:@3 /12/2019  DO WE HAVE YOUR PERMISSION TO LEAVE A DETAILED MESSAGE: yes  OTHER COMMENTS: did not pick up medication on 01/16/2019 so he is out of medication at this time   **Let patient know to contact pharmacy at the end of the day to make sure medication is ready. **  ** Please notify patient to allow 48-72 hours to process**  **Encourage patient to contact the pharmacy for refills or they can request refills through [facility]**

## 2019-03-13 ENCOUNTER — Other Ambulatory Visit: Payer: Self-pay

## 2019-03-13 ENCOUNTER — Ambulatory Visit (INDEPENDENT_AMBULATORY_CARE_PROVIDER_SITE_OTHER): Payer: BC Managed Care – PPO | Admitting: Endocrinology

## 2019-03-13 ENCOUNTER — Encounter: Payer: Self-pay | Admitting: Endocrinology

## 2019-03-13 VITALS — BP 112/70 | HR 75 | Ht 68.5 in | Wt 163.4 lb

## 2019-03-13 DIAGNOSIS — E1010 Type 1 diabetes mellitus with ketoacidosis without coma: Secondary | ICD-10-CM

## 2019-03-13 LAB — POCT GLYCOSYLATED HEMOGLOBIN (HGB A1C): Hemoglobin A1C: 10.9 % — AB (ref 4.0–5.6)

## 2019-03-13 MED ORDER — TRESIBA FLEXTOUCH 200 UNIT/ML ~~LOC~~ SOPN: 140.0000 [IU] | PEN_INJECTOR | Freq: Every day | SUBCUTANEOUS | 3 refills | Status: DC

## 2019-03-13 NOTE — Progress Notes (Signed)
Subjective:    Patient ID: Glen Merritt, male    DOB: 2000/12/11, 19 y.o.   MRN: [id number]  HPI Pt returns for f/u of diabetes mellitus: DM type: 1 Dx'ed: 3545 Complications: none Therapy: insulin since dx DKA: Last episode was in 2016 Severe hypoglycemia: Last episode was in 2010 Pancreatitis: never Pancreatic imaging: never.   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    SDOH: he was ref to consider pump, but he did not make appt;  other: he eats 1 meal per day (supper).   Interval history: Pt says he never missed the [place].  He takes 120 units qd.  He also takes Novolog, for a total of 60-70 units per day.  He has mild hypoglycemia approx twice per week.  no cbg record, but states cbg's vary from 57-400. It is in general lowest in the afternoon.  Past Medical History:  Diagnosis Date  . Hypertension   . Type 1 diabetes mellitus ([place])    Dx 10/2003.  On a pump in the past    No past surgical history on file.  Social History   Socioeconomic History  . Marital status: Single    Spouse name: Not on file  . Number of children: Not on file  . Years of education: Not on file  . Highest education level: Not on file  Occupational History  . Not on file  Tobacco Use  . Smoking status: Never Smoker  . Smokeless tobacco: Never Used  Substance and Sexual Activity  . Alcohol use: No  . Drug use: Not on file  . Sexual activity: Not on file  Other Topics Concern  . Not on file  Social History Narrative   Is in 11th grade at [employer]. Lives with Mother and 2 brothers - ages 60,20; [place] pigs in the house; No smokers in the house.   Social Determinants of Health   Financial Resource Strain:   . Difficulty of Paying Living Expenses:   Food Insecurity:   . Worried About Charity fundraiser in the Last Year:   . Arboriculturist in the Last Year:     Transportation Needs:   . Film/video editor (Medical):   Marland Kitchen Lack of Transportation (Non-Medical):   Physical Activity:   . Days of Exercise per Week:   . Minutes of Exercise per Session:   Stress:   . Feeling of Stress :   Social Connections:   . Frequency of Communication with Friends and Family:   . Frequency of Social Gatherings with Friends and Family:   . Attends Religious Services:   . Active Member of Clubs or Organizations:   . Attends Archivist Meetings:   Marland Kitchen Marital Status:   Intimate Partner Violence:   . Fear of Current or Ex-Partner:   . Emotionally Abused:   Marland Kitchen Physically Abused:   . Sexually Abused:     Current Outpatient Medications on File Prior to Visit  Medication Sig Dispense Refill  . B-D ULTRAFINE III SHORT PEN 31G X 8 MM MISC USE AS DIRECTED TWICE A DAY 180 each 3  . Blood Glucose Monitoring Suppl (ACCU-CHEK GUIDE) w/Device KIT 1 Device by Does not apply route 4 (four) times daily. 1 kit 0  . glucagon 1 MG injection Use for Severe Hypoglycemia . Inject 19m intramuscularly if unresponsive, unable  to swallow, unconscious and/or has seizure. 2 kit 2  . glucose blood (ACCU-CHEK GUIDE) test strip Used to check blood sugars four times daily. 200 each 11  . insulin aspart (NOVOLOG FLEXPEN) 100 UNIT/ML FlexPen Inject 100 units in the skin once per day (Patient taking differently: Inject 80 Units into the skin daily. ) 90 mL prn  . Lancets Misc. (ACCU-CHEK MULTICLIX LANCET DEV) KIT Used to check blood sugars four times daily. 200 each 11  . lisinopril (ZESTRIL) 5 MG tablet Take 1 tablet (5 mg total) by mouth daily. SEND FUTURE REFILL REQUESTS TO NEW PCP. WILL ONLY PROVIDE 90 DAY SUPPLY 30 tablet 2   No current facility-administered medications on file prior to visit.    No Known Allergies  Family History  Problem Relation Age of Onset  . Heart disease Maternal Grandmother   . Heart disease Maternal Grandfather   . Cancer Maternal Grandfather   .  Obesity Mother   . Diabetes Mother   . Hypertension Mother     BP 112/70   Pulse 75   Ht 5' 8.5" (1.74 m)   Wt 163 lb 6.4 oz (74.1 kg)   SpO2 99%   BMI 24.48 kg/m    Review of Systems Denies LOC.      Objective:   Physical Exam VITAL SIGNS:  See vs page GENERAL: no distress Pulses: dorsalis pedis intact bilat.   MSK: no deformity of the feet CV: no leg edema Skin:  no ulcer on the feet.  normal color and temp on the feet. Neuro: sensation is intact to touch on the feet.     Lab Results  Component Value Date   HGBA1C 10.9 (A) 03/13/2019       Assessment & Plan:  Type 1 DM: he needs increased rx. We'll continue plan of emphasizing basal insulin.  Hypoglycemia: this limits aggressiveness of glycemic control, so we'll increase insulin just slightly.   Patient Instructions  check your blood sugar 4 times a day: before the 3 meals, and at bedtime.  also check if you have symptoms of your blood sugar being too high or too low.  please keep a record of the readings and bring it to your next appointment here (or you can bring the meter itself).  You can write it on any piece of paper.  please call us sooner if your blood sugar goes below 70, or if you have a lot of readings over 200.  I have sent a prescription to your pharmacy, to increase the Tresiba to 140 units daily.  This will help you reduce the novolog.  On this type of insulin schedule, you should eat meals on a regular schedule.  If a meal is missed or significantly delayed, your blood sugar could go low.  To avoid the blood sugar being low in the middle of the night, you should eat a snack at bedtime.   Please come back for a follow-up appointment in 2 months.

## 2019-03-13 NOTE — Patient Instructions (Addendum)
check your blood sugar 4 times a day: before the 3 meals, and at bedtime.  also check if you have symptoms of your blood sugar being too high or too low.  please keep a record of the readings and bring it to your next appointment here (or you can bring the meter itself).  You can write it on any piece of paper.  please call us sooner if your blood sugar goes below 70, or if you have a lot of readings over 200.  I have sent a prescription to your pharmacy, to increase the Tresiba to 140 units daily.  This will help you reduce the novolog.  On this type of insulin schedule, you should eat meals on a regular schedule.  If a meal is missed or significantly delayed, your blood sugar could go low.  To avoid the blood sugar being low in the middle of the night, you should eat a snack at bedtime.   Please come back for a follow-up appointment in 2 months.

## 2019-03-16 ENCOUNTER — Encounter: Payer: BC Managed Care – PPO | Admitting: Nutrition

## 2019-05-12 ENCOUNTER — Telehealth: Payer: Self-pay | Admitting: Endocrinology

## 2019-05-12 NOTE — Telephone Encounter (Signed)
Medication Refill Request  Did you call your pharmacy and request this refill first? Yes   . If patient has not contacted pharmacy first, instruct them to do so for future refills.  . Remind them that contacting the pharmacy for their refill is the quickest method to get the refill.  . Refill policy also stated that it will take anywhere between 24-72 hours to receive the refill.    Name of medication? lisinopril  Is this a 90 day supply? yes  Name and location of pharmacy?   [id number] [place], [place] - [address] AT [facility] [address] Phone:  [phone]  Fax:  [phone]

## 2019-05-12 NOTE — Telephone Encounter (Signed)
Please forward refill request to pt's primary care provider.   

## 2019-05-12 NOTE — Telephone Encounter (Signed)
Please advise 

## 2019-05-12 NOTE — Telephone Encounter (Signed)
Called pt and informed about Dr. George Hugh response below. States he does not have a PCP. Advised he will need to call and establish with a PCP as this is a medication he does not manage. Verbalized acceptance and understanding.

## 2019-05-15 ENCOUNTER — Other Ambulatory Visit: Payer: Self-pay

## 2019-05-15 ENCOUNTER — Ambulatory Visit (INDEPENDENT_AMBULATORY_CARE_PROVIDER_SITE_OTHER): Payer: BC Managed Care – PPO | Admitting: Endocrinology

## 2019-05-15 ENCOUNTER — Encounter: Payer: Self-pay | Admitting: Endocrinology

## 2019-05-15 VITALS — BP 120/70 | HR 84 | Ht 68.5 in | Wt 156.0 lb

## 2019-05-15 DIAGNOSIS — E1010 Type 1 diabetes mellitus with ketoacidosis without coma: Secondary | ICD-10-CM

## 2019-05-15 LAB — POCT GLYCOSYLATED HEMOGLOBIN (HGB A1C): Hemoglobin A1C: 11.2 % — AB (ref 4.0–5.6)

## 2019-05-15 MED ORDER — TRESIBA FLEXTOUCH 200 UNIT/ML ~~LOC~~ SOPN: 140.0000 [IU] | PEN_INJECTOR | Freq: Every day | SUBCUTANEOUS | 3 refills | Status: DC

## 2019-05-15 MED ORDER — LISINOPRIL 5 MG PO TABS
5.0000 mg | ORAL_TABLET | Freq: Every day | ORAL | 3 refills | Status: DC
Start: 2019-05-15 — End: 2022-02-09

## 2019-05-15 NOTE — Progress Notes (Signed)
Subjective:    Patient ID: Glen Merritt, male    DOB: 05/24/2000, 19 y.o.   MRN: [id number]  HPI Pt returns for f/u of diabetes mellitus: DM type: 1 Dx'ed: 2094 Complications: none Therapy: insulin since dx DKA: Last episode was in 2016 Severe hypoglycemia: Last episode was in 2010 Pancreatitis: never Pancreatic imaging: never.   Other:  cbg's are lower on work days; he takes multiple daily injections, but plan is to simplify insulin schedule by emphasizing basal.    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   other: he eats 1 meal per day (supper).   Interval history: Pt says he never misses the [place].  He still takes 120 units qd.  He also takes PRN Novolog, for a total of 40 units per day.  He has mild hypoglycemia approx twice per day.  no cbg record, but states cbg's vary from 60-190. It is in general lowest in the afternoon.   Past Medical History:  Diagnosis Date  . Hypertension   . Type 1 diabetes mellitus ([place])    Dx 10/2003.  On a pump in the past    No past surgical history on file.  Social History   Socioeconomic History  . Marital status: Single    Spouse name: Not on file  . Number of children: Not on file  . Years of education: Not on file  . Highest education level: Not on file  Occupational History  . Not on file  Tobacco Use  . Smoking status: Never Smoker  . Smokeless tobacco: Never Used  Substance and Sexual Activity  . Alcohol use: No  . Drug use: Not on file  . Sexual activity: Not on file  Other Topics Concern  . Not on file  Social History Narrative   Is in 11th grade at [employer]. Lives with Mother and 2 brothers - ages 28,20; [place] pigs in the house; No smokers in the house.   Social Determinants of Health   Financial Resource Strain:   . Difficulty of Paying Living Expenses:   Food Insecurity:   . Worried About Charity fundraiser in the Last Year:   . Arboriculturist in the  Last Year:   Transportation Needs:   . Film/video editor (Medical):   Marland Kitchen Lack of Transportation (Non-Medical):   Physical Activity:   . Days of Exercise per Week:   . Minutes of Exercise per Session:   Stress:   . Feeling of Stress :   Social Connections:   . Frequency of Communication with Friends and Family:   . Frequency of Social Gatherings with Friends and Family:   . Attends Religious Services:   . Active Member of Clubs or Organizations:   . Attends Archivist Meetings:   Marland Kitchen Marital Status:   Intimate Partner Violence:   . Fear of Current or Ex-Partner:   . Emotionally Abused:   Marland Kitchen Physically Abused:   . Sexually Abused:     Current Outpatient Medications on File Prior to Visit  Medication Sig Dispense Refill  . B-D ULTRAFINE III SHORT PEN 31G X 8 MM MISC USE AS DIRECTED TWICE A DAY 180 each 3  . Blood Glucose Monitoring Suppl (ACCU-CHEK GUIDE) w/Device KIT 1 Device by Does not apply route 4 (four) times daily. 1 kit 0  . glucagon 1 MG injection Use for Severe Hypoglycemia . Inject 98m  intramuscularly if unresponsive, unable to swallow, unconscious and/or has seizure. 2 kit 2  . glucose blood (ACCU-CHEK GUIDE) test strip Used to check blood sugars four times daily. 200 each 11  . insulin aspart (NOVOLOG FLEXPEN) 100 UNIT/ML FlexPen Inject 100 units in the skin once per day (Patient taking differently: Inject 80 Units into the skin daily. ) 90 mL prn  . Lancets Misc. (ACCU-CHEK MULTICLIX LANCET DEV) KIT Used to check blood sugars four times daily. 200 each 11   No current facility-administered medications on file prior to visit.    No Known Allergies  Family History  Problem Relation Age of Onset  . Heart disease Maternal Grandmother   . Heart disease Maternal Grandfather   . Cancer Maternal Grandfather   . Obesity Mother   . Diabetes Mother   . Hypertension Mother     BP 120/70   Pulse 84   Ht 5' 8.5" (1.74 m)   Wt 156 lb (70.8 kg)   SpO2 98%    BMI 23.37 kg/m    Review of Systems Denies LOC.      Objective:   Physical Exam VITAL SIGNS:  See vs page GENERAL: no distress Pulses: dorsalis pedis intact bilat.   MSK: no deformity of the feet CV: no leg edema Skin:  no ulcer on the feet.  normal color and temp on the feet. Neuro: sensation is intact to touch on the feet.    Lab Results  Component Value Date   HGBA1C 11.2 (A) 05/15/2019      Assessment & Plan:  Type 1 DM: worse.   Hypoglycemia, due to insulin: this limits aggressiveness of glycemic control.  We'll have to increase the [place] to minimize Novolog  Patient Instructions  check your blood sugar 4 times a day: before the 3 meals, and at bedtime.  also check if you have symptoms of your blood sugar being too high or too low.  please keep a record of the readings and bring it to your next appointment here (or you can bring the meter itself).  You can write it on any piece of paper.  please call us sooner if your blood sugar goes below 70, or if you have a lot of readings over 200.  I have sent a prescription to your pharmacy, to increase the Tresiba to 140 units daily.  This will help you reduce the novolog.  On this type of insulin schedule, you should eat meals on a regular schedule.  If a meal is missed or significantly delayed, your blood sugar could go low.  To avoid the blood sugar being low in the middle of the night, you should eat a snack at bedtime.   I have also sent a prescription to your pharmacy, for the lisinopril.   Please come back for a follow-up appointment in 2 months.

## 2019-05-15 NOTE — Patient Instructions (Addendum)
check your blood sugar 4 times a day: before the 3 meals, and at bedtime.  also check if you have symptoms of your blood sugar being too high or too low.  please keep a record of the readings and bring it to your next appointment here (or you can bring the meter itself).  You can write it on any piece of paper.  please call us sooner if your blood sugar goes below 70, or if you have a lot of readings over 200.  I have sent a prescription to your pharmacy, to increase the Tresiba to 140 units daily.  This will help you reduce the novolog.  On this type of insulin schedule, you should eat meals on a regular schedule.  If a meal is missed or significantly delayed, your blood sugar could go low.  To avoid the blood sugar being low in the middle of the night, you should eat a snack at bedtime.   I have also sent a prescription to your pharmacy, for the lisinopril.   Please come back for a follow-up appointment in 2 months.

## 2019-07-17 ENCOUNTER — Encounter: Payer: Self-pay | Admitting: Endocrinology

## 2019-07-17 ENCOUNTER — Ambulatory Visit (INDEPENDENT_AMBULATORY_CARE_PROVIDER_SITE_OTHER): Payer: BC Managed Care – PPO | Admitting: Endocrinology

## 2019-07-17 ENCOUNTER — Other Ambulatory Visit: Payer: Self-pay

## 2019-07-17 VITALS — BP 116/68 | HR 82 | Ht 68.5 in | Wt 153.5 lb

## 2019-07-17 DIAGNOSIS — E109 Type 1 diabetes mellitus without complications: Secondary | ICD-10-CM

## 2019-07-17 DIAGNOSIS — E1010 Type 1 diabetes mellitus with ketoacidosis without coma: Secondary | ICD-10-CM

## 2019-07-17 DIAGNOSIS — E10649 Type 1 diabetes mellitus with hypoglycemia without coma: Secondary | ICD-10-CM

## 2019-07-17 LAB — POCT GLYCOSYLATED HEMOGLOBIN (HGB A1C): Hemoglobin A1C: 10.4 % — AB (ref 4.0–5.6)

## 2019-07-17 MED ORDER — TRESIBA FLEXTOUCH 200 UNIT/ML ~~LOC~~ SOPN: 130.0000 [IU] | PEN_INJECTOR | Freq: Every day | SUBCUTANEOUS | 11 refills | Status: DC

## 2019-07-17 NOTE — Patient Instructions (Addendum)
check your blood sugar 4 times a day: before the 3 meals, and at bedtime.  also check if you have symptoms of your blood sugar being too high or too low.  please keep a record of the readings and bring it to your next appointment here (or you can bring the meter itself).  You can write it on any piece of paper.  please call us sooner if your blood sugar goes below 70, or if you have a lot of readings over 200.  Please continue the same Tresiba: 130 units daily.  Please limit the novolog to a total of 20 units per day.  On this type of insulin schedule, you should eat meals on a regular schedule.  If a meal is missed or significantly delayed, your blood sugar could go low.  To avoid the blood sugar being low in the middle of the night, you should eat a snack at bedtime.     Please come back for a follow-up appointment in 2 months.

## 2019-07-17 NOTE — Progress Notes (Signed)
Subjective:    Patient ID: Glen Merritt, male    DOB: 12/27/2000, 19 y.o.   MRN: [id number]  HPI Pt returns for f/u of diabetes mellitus: DM type: 1 Dx'ed: 4709 Complications: none Therapy: insulin since dx DKA: Last episode was in 2016 Severe hypoglycemia: Last episode was in 2010 Pancreatitis: never Pancreatic imaging: never.   Other:  cbg's are lower on work days; he takes multiple daily injections, but plan is to simplify insulin schedule by emphasizing basal.    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   other: he eats 1 meal per day (supper).   Interval history: Pt had an episode of severe hypoglycemia a few weeks ago, so he decreased the [place] to 130 units qd.  This happened at 3 PM.  He had missed lunch that day.  He also takes PRN Novolog, for a total of 50 units per day.  no cbg record, but states cbg's vary from 150-190.   Past Medical History:  Diagnosis Date  . Hypertension   . Type 1 diabetes mellitus ([place])    Dx 10/2003.  On a pump in the past    No past surgical history on file.  Social History   Socioeconomic History  . Marital status: Single    Spouse name: Not on file  . Number of children: Not on file  . Years of education: Not on file  . Highest education level: Not on file  Occupational History  . Not on file  Tobacco Use  . Smoking status: Never Smoker  . Smokeless tobacco: Never Used  Substance and Sexual Activity  . Alcohol use: No  . Drug use: Not on file  . Sexual activity: Not on file  Other Topics Concern  . Not on file  Social History Narrative   Is in 11th grade at [employer]. Lives with Mother and 2 brothers - ages 92,20; [place] pigs in the house; No smokers in the house.   Social Determinants of Health   Financial Resource Strain:   . Difficulty of Paying Living Expenses:   Food Insecurity:   . Worried About Charity fundraiser in the Last Year:   . Arboriculturist in the  Last Year:   Transportation Needs:   . Film/video editor (Medical):   Marland Kitchen Lack of Transportation (Non-Medical):   Physical Activity:   . Days of Exercise per Week:   . Minutes of Exercise per Session:   Stress:   . Feeling of Stress :   Social Connections:   . Frequency of Communication with Friends and Family:   . Frequency of Social Gatherings with Friends and Family:   . Attends Religious Services:   . Active Member of Clubs or Organizations:   . Attends Archivist Meetings:   Marland Kitchen Marital Status:   Intimate Partner Violence:   . Fear of Current or Ex-Partner:   . Emotionally Abused:   Marland Kitchen Physically Abused:   . Sexually Abused:     Current Outpatient Medications on File Prior to Visit  Medication Sig Dispense Refill  . B-D ULTRAFINE III SHORT PEN 31G X 8 MM MISC USE AS DIRECTED TWICE A DAY 180 each 3  . Blood Glucose Monitoring Suppl (ACCU-CHEK GUIDE) w/Device KIT 1 Device by Does not apply route 4 (four) times daily. 1 kit 0  . glucagon 1 MG injection Use for Severe Hypoglycemia .  Inject 6m intramuscularly if unresponsive, unable to swallow, unconscious and/or has seizure. 2 kit 2  . glucose blood (ACCU-CHEK GUIDE) test strip Used to check blood sugars four times daily. 200 each 11  . insulin aspart (NOVOLOG FLEXPEN) 100 UNIT/ML FlexPen Inject 100 units in the skin once per day (Patient taking differently: Inject 80 Units into the skin daily. ) 90 mL prn  . Lancets Misc. (ACCU-CHEK MULTICLIX LANCET DEV) KIT Used to check blood sugars four times daily. 200 each 11  . lisinopril (ZESTRIL) 5 MG tablet Take 1 tablet (5 mg total) by mouth daily. 90 tablet 3   No current facility-administered medications on file prior to visit.    No Known Allergies  Family History  Problem Relation Age of Onset  . Heart disease Maternal Grandmother   . Heart disease Maternal Grandfather   . Cancer Maternal Grandfather   . Obesity Mother   . Diabetes Mother   . Hypertension Mother      BP 116/68 (BP Location: Right Arm, Patient Position: Sitting, Cuff Size: Normal)   Pulse 82   Ht 5' 8.5" (1.74 m)   Wt 153 lb 8 oz (69.6 kg)   SpO2 97%   BMI 23.00 kg/m    Review of Systems     Objective:   Physical Exam VITAL SIGNS:  See vs page GENERAL: no distress Pulses: dorsalis pedis intact bilat.   MSK: no deformity of the feet CV: no leg edema Skin:  no ulcer on the feet.  normal color and temp on the feet.   Neuro: sensation is intact to touch on the feet.    Lab Results  Component Value Date   HGBA1C 10.4 (A) 07/17/2019       Assessment & Plan:  Type 1 DM.  Hypoglycemia: severe exacerbation.  He needs simplest possible regimen.   Patient Instructions  check your blood sugar 4 times a day: before the 3 meals, and at bedtime.  also check if you have symptoms of your blood sugar being too high or too low.  please keep a record of the readings and bring it to your next appointment here (or you can bring the meter itself).  You can write it on any piece of paper.  please call [facility] if your blood sugar goes below 70, or if you have a lot of readings over 200.  Please continue the same Tresiba: 130 units daily.  Please limit the novolog to a total of 20 units per day.  On this type of insulin schedule, you should eat meals on a regular schedule.  If a meal is missed or significantly delayed, your blood sugar could go low.  To avoid the blood sugar being low in the middle of the night, you should eat a snack at bedtime.     Please come back for a follow-up appointment in 2 months.

## 2019-09-18 ENCOUNTER — Ambulatory Visit: Payer: BC Managed Care – PPO | Admitting: Endocrinology

## 2019-10-21 ENCOUNTER — Other Ambulatory Visit: Payer: Self-pay

## 2019-10-21 ENCOUNTER — Ambulatory Visit (INDEPENDENT_AMBULATORY_CARE_PROVIDER_SITE_OTHER): Payer: BC Managed Care – PPO | Admitting: Endocrinology

## 2019-10-21 ENCOUNTER — Encounter: Payer: Self-pay | Admitting: Endocrinology

## 2019-10-21 VITALS — BP 122/78 | HR 80 | Ht 68.5 in | Wt 158.0 lb

## 2019-10-21 DIAGNOSIS — E10649 Type 1 diabetes mellitus with hypoglycemia without coma: Secondary | ICD-10-CM

## 2019-10-21 DIAGNOSIS — E109 Type 1 diabetes mellitus without complications: Secondary | ICD-10-CM

## 2019-10-21 LAB — POCT GLYCOSYLATED HEMOGLOBIN (HGB A1C): Hemoglobin A1C: 9.8 % — AB (ref 4.0–5.6)

## 2019-10-21 MED ORDER — TRESIBA FLEXTOUCH 200 UNIT/ML ~~LOC~~ SOPN: 110.0000 [IU] | PEN_INJECTOR | Freq: Every day | SUBCUTANEOUS | 3 refills | Status: DC

## 2019-10-21 MED ORDER — NOVOLOG FLEXPEN 100 UNIT/ML ~~LOC~~ SOPN: PEN_INJECTOR | SUBCUTANEOUS | 99 refills | Status: DC

## 2019-10-21 NOTE — Patient Instructions (Addendum)
check your blood sugar 4 times a day: before the 3 meals, and at bedtime.  also check if you have symptoms of your blood sugar being too high or too low.  please keep a record of the readings and bring it to your next appointment here (or you can bring the meter itself).  You can write it on any piece of paper.  please call us sooner if your blood sugar goes below 70, or if you have a lot of readings over 200.  Please increase the Tresiba to 110 units daily.  Please limit the novolog to a total of 50 units per day.  On this type of insulin schedule, you should eat meals on a regular schedule.  If a meal is missed or significantly delayed, your blood sugar could go low.  To avoid the blood sugar being low in the middle of the night, you should eat a snack at bedtime.     Please come back for a follow-up appointment in 6 weeks.

## 2019-10-21 NOTE — Progress Notes (Signed)
Subjective:    Patient ID: Glen Merritt, male    DOB: 2000/11/25, 19 y.o.   MRN: [id number]  HPI Pt returns for f/u of diabetes mellitus: DM type: 1 Dx'ed: 7564 Complications: none Therapy: insulin since dx DKA: last episode was in 2016 Severe hypoglycemia: last episode was in 2021 Pancreatitis: never Pancreatic imaging: never.   Other:  cbg's are lower on work days; he takes multiple daily injections, but plan is to simplify insulin schedule by emphasizing basal.    SDOH: he was ref to consider pump, but he did not make appt.   other: he eats 1 meal per day (supper).   Interval history: he decreased the [place] to 100 units qd, 7 weeks ago.  He takes PRN Novolog, for a total of 75 units per day.  no cbg record, but states cbg's are in the 200's.  He no longer has a strenuous job.   Past Medical History:  Diagnosis Date  . Hypertension   . Type 1 diabetes mellitus ([place])    Dx 10/2003.  On a pump in the past    No past surgical history on file.  Social History   Socioeconomic History  . Marital status: Single    Spouse name: Not on file  . Number of children: Not on file  . Years of education: Not on file  . Highest education level: Not on file  Occupational History  . Not on file  Tobacco Use  . Smoking status: Never Smoker  . Smokeless tobacco: Never Used  Substance and Sexual Activity  . Alcohol use: No  . Drug use: Not on file  . Sexual activity: Not on file  Other Topics Concern  . Not on file  Social History Narrative   Is in 11th grade at [employer]. Lives with Mother and 2 brothers - ages 17,20; [place] pigs in the house; No smokers in the house.   Social Determinants of Health   Financial Resource Strain:   . Difficulty of Paying Living Expenses: Not on file  Food Insecurity:   . Worried About Charity fundraiser in the Last Year: Not on file  . Ran Out of Food in the Last Year: Not on file  Transportation Needs:   . Lack of Transportation (Medical): Not  on file  . Lack of Transportation (Non-Medical): Not on file  Physical Activity:   . Days of Exercise per Week: Not on file  . Minutes of Exercise per Session: Not on file  Stress:   . Feeling of Stress : Not on file  Social Connections:   . Frequency of Communication with Friends and Family: Not on file  . Frequency of Social Gatherings with Friends and Family: Not on file  . Attends Religious Services: Not on file  . Active Member of Clubs or Organizations: Not on file  . Attends Archivist Meetings: Not on file  . Marital Status: Not on file  Intimate Partner Violence:   . Fear of Current or Ex-Partner: Not on file  . Emotionally Abused: Not on file  . Physically Abused: Not on file  . Sexually Abused: Not on file    Current Outpatient Medications on File Prior to Visit  Medication Sig Dispense Refill  . B-D ULTRAFINE III SHORT PEN 31G X 8 MM MISC USE AS DIRECTED TWICE A DAY 180 each 3  . Blood Glucose Monitoring Suppl (ACCU-CHEK GUIDE) w/Device KIT 1 Device by Does not apply route 4 (four) times  daily. 1 kit 0  . glucagon 1 MG injection Use for Severe Hypoglycemia . Inject 66m intramuscularly if unresponsive, unable to swallow, unconscious and/or has seizure. 2 kit 2  . glucose blood (ACCU-CHEK GUIDE) test strip Used to check blood sugars four times daily. 200 each 11  . Lancets Misc. (ACCU-CHEK MULTICLIX LANCET DEV) KIT Used to check blood sugars four times daily. 200 each 11  . lisinopril (ZESTRIL) 5 MG tablet Take 1 tablet (5 mg total) by mouth daily. 90 tablet 3   No current facility-administered medications on file prior to visit.    No Known Allergies  Family History  Problem Relation Age of Onset  . Heart disease Maternal Grandmother   . Heart disease Maternal Grandfather   . Cancer Maternal Grandfather   . Obesity Mother   . Diabetes Mother   . Hypertension Mother     BP 122/78   Pulse 80   Ht 5' 8.5" (1.74 m)   Wt 158 lb (71.7 kg)   BMI 23.67  kg/m    Review of Systems     Objective:   Physical Exam VITAL SIGNS:  See vs page GENERAL: no distress Pulses: dorsalis pedis intact bilat.   MSK: no deformity of the feet CV: no leg edema Skin:  no ulcer on the feet.  normal color and temp on the feet. Neuro: sensation is intact to touch on the feet   Lab Results  Component Value Date   HGBA1C 9.8 (A) 10/21/2019       Assessment & Plan:  Type1 DM: uncontrolled.  We discussed.  He agrees to increase [place] 110 units/d.  I told pt that our goal continues to be to minimize or d/c Novolog.    Patient Instructions  check your blood sugar 4 times a day: before the 3 meals, and at bedtime.  also check if you have symptoms of your blood sugar being too high or too low.  please keep a record of the readings and bring it to your next appointment here (or you can bring the meter itself).  You can write it on any piece of paper.  please call [facility] if your blood sugar goes below 70, or if you have a lot of readings over 200.  Please increase the Tresiba to 110 units daily.  Please limit the novolog to a total of 50 units per day.  On this type of insulin schedule, you should eat meals on a regular schedule.  If a meal is missed or significantly delayed, your blood sugar could go low.  To avoid the blood sugar being low in the middle of the night, you should eat a snack at bedtime.     Please come back for a follow-up appointment in 6 weeks.

## 2019-10-29 ENCOUNTER — Other Ambulatory Visit: Payer: Self-pay

## 2019-10-29 ENCOUNTER — Ambulatory Visit
Admission: RE | Admit: 2019-10-29 | Discharge: 2019-10-29 | Disposition: A | Discharge status: Home / Self Care | Payer: BC Managed Care – PPO | Source: Ambulatory Visit | Attending: Emergency Medicine | Admitting: Emergency Medicine

## 2019-10-29 VITALS — BP 153/98 | HR 110 | Temp 98.4°F | Resp 18

## 2019-10-29 DIAGNOSIS — R590 Localized enlarged lymph nodes: Secondary | ICD-10-CM | POA: Insufficient documentation

## 2019-10-29 DIAGNOSIS — Z7251 High risk heterosexual behavior: Secondary | ICD-10-CM | POA: Insufficient documentation

## 2019-10-29 DIAGNOSIS — Z202 Contact with and (suspected) exposure to infections with a predominantly sexual mode of transmission: Secondary | ICD-10-CM | POA: Diagnosis not present

## 2019-10-29 DIAGNOSIS — N489 Disorder of penis, unspecified: Secondary | ICD-10-CM | POA: Insufficient documentation

## 2019-10-29 MED ORDER — PENICILLIN G BENZATHINE 1200000 UNIT/2ML IM SUSP
2.4000 10*6.[IU] | Freq: Once | INTRAMUSCULAR | Status: AC
  Administered 2019-10-29: 2.4 10*6.[IU] via INTRAMUSCULAR

## 2019-10-29 NOTE — ED Provider Notes (Signed)
[facility]    CSN: [id number] Arrival date & time: 10/29/19  1858      History   Chief Complaint Chief Complaint  Patient presents with  . SEXUALLY TRANSMITTED DISEASE    HPI Glen Merritt is a 19 y.o. male  With history of type 1 diabetes, hypertension presenting for STD testing.  Patient denying penile discharge, penile or testicular pain or swelling, urinary symptoms, Donnell pain, back pain, fever.  States that he has a bump to the left shaft of his penis that is nontender.  Thought it was initially from shaving, the one go away.  Concern for syphilis.  Endorsing left inguinal lymphadenopathy that is nontender.  Patient states that he looked up symptoms online these are consistent with syphilis.  When asked, patient does admit to possible secondary exposure via male partner.  Denies history of syphilis.  Past Medical History:  Diagnosis Date  . Hypertension   . Type 1 diabetes mellitus ([place])    Dx 10/2003.  On a pump in the past    Patient Active Problem List   Diagnosis Date Noted  . Noncompliance with diabetes treatment 11/18/2015  . 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 Maladaptive health behaviors affecting medical condition 02/02/2015  . Essential hypertension 02/02/2015  . Type 1 diabetes mellitus without complication ([place]) 99/35/7017  . Ketonuria 09/29/2014  . Type 1 diabetes mellitus with ketoacidosis and without coma ([place])     History reviewed. No pertinent surgical history.     Home Medications    Prior to Admission medications   Medication Sig Start Date End Date Taking? Authorizing Provider  B-D ULTRAFINE III SHORT PEN 31G X 8 MM MISC USE AS DIRECTED TWICE A DAY 09/28/18   Renato Shin, MD  Blood Glucose Monitoring Suppl (ACCU-CHEK GUIDE) w/Device KIT 1 Device by Does not apply route 4 (four) times daily. 07/11/18   Renato Shin, MD  glucagon 1 MG injection Use for Severe Hypoglycemia . Inject 29m  intramuscularly if unresponsive, unable to swallow, unconscious and/or has seizure. 11/19/14   JLevon Hedger MD  glucose blood (ACCU-CHEK GUIDE) test strip Used to check blood sugars four times daily. 05/17/17   ERenato Shin MD  insulin aspart (NOVOLOG FLEXPEN) 100 UNIT/ML FlexPen Take as directed, for a total of 50 units per day. 10/21/19   ERenato Shin MD  insulin degludec (TRESIBA FLEXTOUCH) 200 UNIT/ML FlexTouch Pen Inject 110 Units into the skin daily. 10/21/19   ERenato Shin MD  Lancets Misc. (ACCU-CHEK MULTICLIX LANCET DEV) KIT Used to check blood sugars four times daily. 05/17/17   ERenato Shin MD  lisinopril (ZESTRIL) 5 MG tablet Take 1 tablet (5 mg total) by mouth daily. 05/15/19   ERenato Shin MD    Family History Family History  Problem Relation Age of Onset  . Heart disease Maternal Grandmother   . Heart disease Maternal Grandfather   . Cancer Maternal Grandfather   . Obesity Mother   . Diabetes Mother   . Hypertension Mother     Social History Social History   Tobacco Use  . Smoking status: Never Smoker  . Smokeless tobacco: Never Used  Substance Use Topics  . Alcohol use: No  . Drug use: Not on file     Allergies   Patient has no known allergies.   Review of Systems Review of Systems  Genitourinary: Positive for genital sores. Negative for discharge, dysuria, penile pain, penile swelling, scrotal swelling and testicular pain.  All other systems reviewed and are negative.    Physical Exam Triage Vital Signs ED Triage Vitals [10/29/19 1910]  Enc Vitals Group     BP (!) 153/98     Pulse Rate (!) 110     Resp 18     Temp 98.4 F (36.9 C)     Temp Source Oral     SpO2 95 %     Weight      Height      Head Circumference      Peak Flow      Pain Score 0     Pain Loc      Pain Edu?      Excl. in [place]?    No data found.  Updated Vital Signs BP (!) 153/98 (BP Location: Left Arm)   Pulse (!) 110   Temp 98.4 F (36.9 C) (Oral)    Resp 18   SpO2 95%   Visual Acuity Right Eye Distance:   Left Eye Distance:   Bilateral Distance:    Right Eye Near:   Left Eye Near:    Bilateral Near:     Physical Exam Constitutional:      General: He is not in acute distress. HENT:     Head: Normocephalic and atraumatic.  Eyes:     General: No scleral icterus.    Pupils: Pupils are equal, round, and reactive to light.  Cardiovascular:     Rate and Rhythm: Normal rate.  Pulmonary:     Effort: Pulmonary effort is normal. No respiratory distress.     Breath sounds: No wheezing.  Genitourinary:    Testes: Normal.     Comments: Left base of shaft with 3 mm superficial circumferential lesion.  Nontender.  Left inguinal lymph nodes palpable, nontender Skin:    Coloration: Skin is not jaundiced or pale.  Neurological:     Mental Status: He is alert and oriented to person, place, and time.      UC Treatments / Results  Labs (all labs ordered are listed, but only abnormal results are displayed) Labs Reviewed  RPR  HIV ANTIBODY (ROUTINE TESTING W REFLEX)  CYTOLOGY, (ORAL, ANAL, URETHRAL) ANCILLARY ONLY    EKG   Radiology No results found.  Procedures Procedures (including critical care time)  Medications Ordered in UC Medications  penicillin g benzathine (BICILLIN LA) 1200000 UNIT/2ML injection 2.4 Million Units (2.4 Million Units Intramuscular Given 10/29/19 1939)    Initial Impression / Assessment and Plan / UC Course  I have reviewed the triage vital signs and the nursing notes.  Pertinent labs & imaging results that were available during my care of the patient were reviewed by me and considered in my medical decision making (see chart for details).     Patient with possible syphilis exposure, penile lesion and inguinal lymphadenopathy.  Cytology, RPR, HIV pending.  Patient denies history thereof.  Will treat empirically.  Tolerated 2,400,000 units of Bicillin well.  Will follow up with ID.  Return  precautions discussed, pt verbalized understanding and is agreeable to plan. Final Clinical Impressions(s) / UC Diagnoses   Final diagnoses:  Exposure to syphilis  Unprotected sex  Penile lesion  Inguinal lymphadenopathy     Discharge Instructions     Today you received treatment for syphilis. Testing for chlamydia, gonorrhea, trichomonas is pending: please look for these results on the MyChart app/website.  We will notify you if you are positive and outline treatment at that time.  Important to avoid  all forms of sexual intercourse (oral, vaginal, anal) with any/all partners for the next 7 days to avoid spreading/reinfecting. Any/all sexual partners should be notified of testing/treatment today.  Return for persistent/worsening symptoms or if you develop fever, abdominal or pelvic pain, discharge, genital pain, blood in your urine, or are re-exposed to an STI.    ED Prescriptions    None     PDMP not reviewed this encounter.   Neldon [place], [place] 10/29/19 1944

## 2019-10-29 NOTE — Discharge Instructions (Signed)
Today you received treatment for syphilis. Testing for chlamydia, gonorrhea, trichomonas is pending: please look for these results on the MyChart app/website.  We will notify you if you are positive and outline treatment at that time.  Important to avoid all forms of sexual intercourse (oral, vaginal, anal) with any/all partners for the next 7 days to avoid spreading/reinfecting. Any/all sexual partners should be notified of testing/treatment today.  Return for persistent/worsening symptoms or if you develop fever, abdominal or pelvic pain, discharge, genital pain, blood in your urine, or are re-exposed to an STI.

## 2019-10-29 NOTE — ED Triage Notes (Signed)
Pt requesting STD testing. Denies penile discharge but states has a bump to shaft of penis x1wk that he thought was from shaving but won't go away.

## 2019-10-31 LAB — RPR: RPR Ser Ql: NONREACTIVE

## 2019-10-31 LAB — HIV ANTIBODY (ROUTINE TESTING W REFLEX): HIV Screen 4th Generation wRfx: NONREACTIVE

## 2019-11-02 LAB — CYTOLOGY, (ORAL, ANAL, URETHRAL) ANCILLARY ONLY
Chlamydia: NEGATIVE
Comment: NEGATIVE
Comment: NEGATIVE
Comment: NORMAL
Neisseria Gonorrhea: NEGATIVE
Trichomonas: NEGATIVE

## 2019-12-03 ENCOUNTER — Other Ambulatory Visit: Payer: Self-pay

## 2019-12-03 ENCOUNTER — Encounter: Payer: Self-pay | Admitting: Endocrinology

## 2019-12-03 ENCOUNTER — Ambulatory Visit (INDEPENDENT_AMBULATORY_CARE_PROVIDER_SITE_OTHER): Payer: BC Managed Care – PPO | Admitting: Endocrinology

## 2019-12-03 VITALS — BP 126/82 | HR 102 | Ht 69.5 in | Wt 156.0 lb

## 2019-12-03 DIAGNOSIS — E109 Type 1 diabetes mellitus without complications: Secondary | ICD-10-CM

## 2019-12-03 DIAGNOSIS — E10649 Type 1 diabetes mellitus with hypoglycemia without coma: Secondary | ICD-10-CM | POA: Diagnosis not present

## 2019-12-03 LAB — POCT GLYCOSYLATED HEMOGLOBIN (HGB A1C): Hemoglobin A1C: 9.9 % — AB (ref 4.0–5.6)

## 2019-12-03 MED ORDER — NOVOLOG FLEXPEN 100 UNIT/ML ~~LOC~~ SOPN: PEN_INJECTOR | SUBCUTANEOUS | 99 refills | Status: DC

## 2019-12-03 MED ORDER — TRESIBA FLEXTOUCH 200 UNIT/ML ~~LOC~~ SOPN
120.0000 [IU] | PEN_INJECTOR | Freq: Every day | SUBCUTANEOUS | 3 refills | Status: DC
Start: 2019-12-03 — End: 2020-07-05

## 2019-12-03 NOTE — Patient Instructions (Addendum)
check your blood sugar 4 times a day: before the 3 meals, and at bedtime.  also check if you have symptoms of your blood sugar being too high or too low.  please keep a record of the readings and bring it to your next appointment here (or you can bring the meter itself).  You can write it on any piece of paper.  please call us sooner if your blood sugar goes below 70, or if you have a lot of readings over 200.  Please increase the Tresiba to 120 units daily.    Please limit the novolog to a total of 40 units per day.   On this type of insulin schedule, you should eat meals on a regular schedule (especially lunch).  If a meal is missed or significantly delayed, your blood sugar could go low.   To avoid the blood sugar being low in the middle of the night, you should eat a snack at bedtime.   Blood tests are requested for you today.  We'll let you know about the results.  Please come back for a follow-up appointment in 2 months.

## 2019-12-03 NOTE — Progress Notes (Signed)
Subjective:    Patient ID: Glen Merritt, male    DOB: Jul 28, 2000, 19 y.o.   MRN: [id number]  HPI Pt returns for f/u of diabetes mellitus:  DM type: 1 Dx'ed: 3845 Complications: none Therapy: insulin since dx DKA: last episode was in 2016 Severe hypoglycemia: last episode was in 2021 Pancreatitis: never Pancreatic imaging: never.   Other:  cbg's are lower on work days; he takes multiple daily injections, but plan is to simplify insulin schedule by emphasizing basal.     SDOH: he was ref to consider pump, but he did not make appt; He no longer has a strenuous job.   other: he eats 1 meal per day (supper).   Interval history:  He takes PRN Novolog, for a total of 45-50 units per day.  He takes this with breakfast and supper.  no cbg record, but states cbg's are in the 200's.   He has mild hypoglycemia twice per day.    Past Medical History:  Diagnosis Date  . Hypertension   . Type 1 diabetes mellitus ([place])    Dx 10/2003.  On a pump in the past    No past surgical history on file.  Social History   Socioeconomic History  . Marital status: Single    Spouse name: Not on file  . Number of children: Not on file  . Years of education: Not on file  . Highest education level: Not on file  Occupational History  . Not on file  Tobacco Use  . Smoking status: Never Smoker  . Smokeless tobacco: Never Used  Substance and Sexual Activity  . Alcohol use: No  . Drug use: Not on file  . Sexual activity: Not on file  Other Topics Concern  . Not on file  Social History Narrative   Is in 11th grade at [employer]. Lives with Mother and 2 brothers - ages 53,20; [place] pigs in the house; No smokers in the house.   Social Determinants of Health   Financial Resource Strain:   . Difficulty of Paying Living Expenses: Not on file  Food Insecurity:   . Worried About Charity fundraiser in the Last Year: Not on file  . Ran Out of Food in the Last Year: Not on file  Transportation Needs:   . Lack  of Transportation (Medical): Not on file  . Lack of Transportation (Non-Medical): Not on file  Physical Activity:   . Days of Exercise per Week: Not on file  . Minutes of Exercise per Session: Not on file  Stress:   . Feeling of Stress : Not on file  Social Connections:   . Frequency of Communication with Friends and Family: Not on file  . Frequency of Social Gatherings with Friends and Family: Not on file  . Attends Religious Services: Not on file  . Active Member of Clubs or Organizations: Not on file  . Attends Archivist Meetings: Not on file  . Marital Status: Not on file  Intimate Partner Violence:   . Fear of Current or Ex-Partner: Not on file  . Emotionally Abused: Not on file  . Physically Abused: Not on file  . Sexually Abused: Not on file    Current Outpatient Medications on File Prior to Visit  Medication Sig Dispense Refill  . B-D ULTRAFINE III SHORT PEN 31G X 8 MM MISC USE AS DIRECTED TWICE A DAY 180 each 3  . Blood Glucose Monitoring Suppl (ACCU-CHEK GUIDE) w/Device KIT 1 Device  by Does not apply route 4 (four) times daily. 1 kit 0  . glucagon 1 MG injection Use for Severe Hypoglycemia . Inject 86m intramuscularly if unresponsive, unable to swallow, unconscious and/or has seizure. 2 kit 2  . glucose blood (ACCU-CHEK GUIDE) test strip Used to check blood sugars four times daily. 200 each 11  . Lancets Misc. (ACCU-CHEK MULTICLIX LANCET DEV) KIT Used to check blood sugars four times daily. 200 each 11  . lisinopril (ZESTRIL) 5 MG tablet Take 1 tablet (5 mg total) by mouth daily. 90 tablet 3   No current facility-administered medications on file prior to visit.    No Known Allergies  Family History  Problem Relation Age of Onset  . Heart disease Maternal Grandmother   . Heart disease Maternal Grandfather   . Cancer Maternal Grandfather   . Obesity Mother   . Diabetes Mother   . Hypertension Mother     BP 126/82   Pulse (!) 102   Ht 5' 9.5" (1.765 m)    Wt 156 lb (70.8 kg)   SpO2 97%   BMI 22.71 kg/m    Review of Systems Denies LOC    Objective:   Physical Exam VITAL SIGNS:  See vs page GENERAL: no distress.  Alert and cooperative Pulses: dorsalis pedis intact bilat.   MSK: no deformity of the feet CV: no leg edema Skin:  no ulcer on the feet.  normal color and temp on the feet. Neuro: sensation is intact to touch on the feet.     Lab Results  Component Value Date   HGBA1C 9.9 (A) 12/03/2019      Assessment & Plan:  Type 1 DM: uncontrolled.  We'll further emphasize basal, to simplify regimen.  Goals are improved glycemic control, avoid severe hypoglycemia, and avoidance of DKA.  He is unlikely to achieve A1c of 7% Hypoglycemia, due to insulin: this limits aggressiveness of glycemic control.  For cbg of 61, pt is given food to eat, and advised to eat it.  He declines to eat.  Patient Instructions  check your blood sugar 4 times a day: before the 3 meals, and at bedtime.  also check if you have symptoms of your blood sugar being too high or too low.  please keep a record of the readings and bring it to your next appointment here (or you can bring the meter itself).  You can write it on any piece of paper.  please call [facility] if your blood sugar goes below 70, or if you have a lot of readings over 200.  Please increase the Tresiba to 120 units daily.    Please limit the novolog to a total of 40 units per day.   On this type of insulin schedule, you should eat meals on a regular schedule (especially lunch).  If a meal is missed or significantly delayed, your blood sugar could go low.   To avoid the blood sugar being low in the middle of the night, you should eat a snack at bedtime.   Blood tests are requested for you today.  We'll let you know about the results.  Please come back for a follow-up appointment in 2 months.

## 2019-12-10 ENCOUNTER — Other Ambulatory Visit: Payer: BC Managed Care – PPO

## 2020-02-04 ENCOUNTER — Ambulatory Visit: Payer: BC Managed Care – PPO | Admitting: Endocrinology

## 2020-03-02 LAB — HM DIABETES EYE EXAM

## 2020-06-14 ENCOUNTER — Encounter: Payer: Self-pay | Admitting: Endocrinology

## 2020-06-14 ENCOUNTER — Other Ambulatory Visit: Payer: Self-pay | Admitting: Endocrinology

## 2020-06-14 DIAGNOSIS — E10649 Type 1 diabetes mellitus with hypoglycemia without coma: Secondary | ICD-10-CM

## 2020-06-15 ENCOUNTER — Other Ambulatory Visit: Payer: Self-pay

## 2020-06-15 DIAGNOSIS — E10649 Type 1 diabetes mellitus with hypoglycemia without coma: Secondary | ICD-10-CM

## 2020-06-15 MED ORDER — NOVOLOG FLEXPEN 100 UNIT/ML ~~LOC~~ SOPN: PEN_INJECTOR | SUBCUTANEOUS | 99 refills | Status: DC

## 2020-07-05 ENCOUNTER — Ambulatory Visit (INDEPENDENT_AMBULATORY_CARE_PROVIDER_SITE_OTHER): Payer: BC Managed Care – PPO | Admitting: Endocrinology

## 2020-07-05 ENCOUNTER — Other Ambulatory Visit: Payer: Self-pay

## 2020-07-05 VITALS — BP 122/84 | HR 96 | Ht 69.5 in | Wt 161.6 lb

## 2020-07-05 DIAGNOSIS — E109 Type 1 diabetes mellitus without complications: Secondary | ICD-10-CM

## 2020-07-05 LAB — POCT GLYCOSYLATED HEMOGLOBIN (HGB A1C): Hemoglobin A1C: 10.5 % — AB (ref 4.0–5.6)

## 2020-07-05 MED ORDER — TRESIBA FLEXTOUCH 200 UNIT/ML ~~LOC~~ SOPN
80.0000 [IU] | PEN_INJECTOR | Freq: Every day | SUBCUTANEOUS | 3 refills | Status: DC
Start: 2020-07-05 — End: 2020-11-02

## 2020-07-05 NOTE — Progress Notes (Signed)
Subjective:    Patient ID: Glen Merritt, male    DOB: [birth year], 20 y.o.   MRN: [id number]  HPI Pt returns for f/u of diabetes mellitus:  DM type: 1 Dx'ed: 8882 Complications: none Therapy: insulin since dx DKA: last episode was in 2016 Severe hypoglycemia: last episode was in 2021 Pancreatitis: never.   Pancreatic imaging: never.   Other:  cbg's are lower on work days; he takes multiple daily injections, but plan is to simplify insulin schedule by emphasizing basal.     SDOH: he was ref to consider pump, but he did not make appt; He works at [employer], 1st shift.   other: he eats 1 meal per day (supper).   Interval history:  He takes [place], just 70 units qd, and PRN Novolog, for a total of approx 40 units per day.  He takes this with breakfast and supper.  no cbg record, but states cbg's vary from 42-400.  It is lowest with exertion, and when a meal is missed.  He has mild hypoglycemia once per day.   Past Medical History:  Diagnosis Date   Hypertension    Type 1 diabetes mellitus ([place])    Dx 10/2003.  On a pump in the past    No past surgical history on file.  Social History   Socioeconomic History   Marital status: Single    Spouse name: Not on file   Number of children: Not on file   Years of education: Not on file   Highest education level: Not on file  Occupational History   Not on file  Tobacco Use   Smoking status: Never   Smokeless tobacco: Never  Substance and Sexual Activity   Alcohol use: No   Drug use: Not on file   Sexual activity: Not on file  Other Topics Concern   Not on file  Social History Narrative   Is in 11th grade at [employer]. Lives with Mother and 2 brothers - ages 86,20; [place] pigs in the house; No smokers in the house.   Social Determinants of Health   Financial Resource Strain: Not on file  Food Insecurity: Not on file  Transportation Needs: Not on file  Physical Activity: Not on file  Stress: Not on file  Social Connections: Not on  file  Intimate Partner Violence: Not on file    Current Outpatient Medications on File Prior to Visit  Medication Sig Dispense Refill   B-D ULTRAFINE III SHORT PEN 31G X 8 MM MISC USE AS DIRECTED TWICE A DAY 180 each 3   Blood Glucose Monitoring Suppl (ACCU-CHEK GUIDE) w/Device KIT 1 Device by Does not apply route 4 (four) times daily. 1 kit 0   glucagon 1 MG injection Use for Severe Hypoglycemia . Inject 75m intramuscularly if unresponsive, unable to swallow, unconscious and/or has seizure. 2 kit 2   glucose blood (ACCU-CHEK GUIDE) test strip Used to check blood sugars four times daily. 200 each 11   insulin aspart (NOVOLOG FLEXPEN) 100 UNIT/ML FlexPen Take as directed, for a total of 40 units per day. 30 mL prn   Lancets Misc. (ACCU-CHEK MULTICLIX LANCET DEV) KIT Used to check blood sugars four times daily. 200 each 11   lisinopril (ZESTRIL) 5 MG tablet Take 1 tablet (5 mg total) by mouth daily. 90 tablet 3   No current facility-administered medications on file prior to visit.    No Known Allergies  Family History  Problem Relation Age of Onset   Heart  disease Maternal Grandmother    Heart disease Maternal Grandfather    Cancer Maternal Grandfather    Obesity Mother    Diabetes Mother    Hypertension Mother     BP 122/84 (BP Location: Right Arm, Patient Position: Sitting, Cuff Size: Normal)   Pulse 96   Ht 5' 9.5" (1.765 m)   Wt 161 lb 9.6 oz (73.3 kg)   SpO2 98%   BMI 23.52 kg/m    Review of Systems Denies LOC.     Objective:   Physical Exam Pulses: dorsalis pedis intact bilat.   MSK: no deformity of the feet CV: no leg edema Skin:  no ulcer on the feet.  normal color and temp on the feet. Neuro: sensation is intact to touch on the feet  Lab Results  Component Value Date   HGBA1C 10.5 (A) 07/05/2020       Assessment & Plan:  Type 1 DM: uncontrolled  Patient Instructions  check your blood sugar 4 times a day: before the 3 meals, and at bedtime.  also check  if you have symptoms of your blood sugar being too high or too low.  please keep a record of the readings and bring it to your next appointment here (or you can bring the meter itself).  You can write it on any piece of paper.  please call us sooner if your blood sugar goes below 70, or if you have a lot of readings over 200.   Please increase the Tresiba to 80 units daily.   Please limit the novolog to a total of 40 units per day.   On this type of insulin schedule, you should eat meals on a regular schedule (especially lunch).  If a meal is missed or significantly delayed, your blood sugar could go low.  Also, you should carry crackers or cookies with you, in case your blood sugar goes low.   To avoid the blood sugar being low in the middle of the night, you should eat a snack at bedtime.   Please come back for a follow-up appointment in 3 months.

## 2020-07-05 NOTE — Patient Instructions (Addendum)
check your blood sugar 4 times a day: before the 3 meals, and at bedtime.  also check if you have symptoms of your blood sugar being too high or too low.  please keep a record of the readings and bring it to your next appointment here (or you can bring the meter itself).  You can write it on any piece of paper.  please call us sooner if your blood sugar goes below 70, or if you have a lot of readings over 200.   Please increase the Tresiba to 80 units daily.   Please limit the novolog to a total of 40 units per day.   On this type of insulin schedule, you should eat meals on a regular schedule (especially lunch).  If a meal is missed or significantly delayed, your blood sugar could go low.  Also, you should carry crackers or cookies with you, in case your blood sugar goes low.   To avoid the blood sugar being low in the middle of the night, you should eat a snack at bedtime.   Please come back for a follow-up appointment in 3 months.

## 2020-08-16 ENCOUNTER — Other Ambulatory Visit: Payer: Self-pay

## 2020-08-16 ENCOUNTER — Telehealth: Payer: Self-pay | Admitting: Endocrinology

## 2020-08-16 DIAGNOSIS — E10649 Type 1 diabetes mellitus with hypoglycemia without coma: Secondary | ICD-10-CM

## 2020-08-16 MED ORDER — NOVOLOG FLEXPEN 100 UNIT/ML ~~LOC~~ SOPN: PEN_INJECTOR | SUBCUTANEOUS | 99 refills | Status: DC

## 2020-08-16 NOTE — Telephone Encounter (Signed)
MEDICATION: Novolog Flexpen  PHARMACY:  [facility] THE PATIENT CONTACTED THEIR PHARMACY?  yes  IS THIS A 90 DAY SUPPLY : patient requesting  IS PATIENT OUT OF MEDICATION:   IF NOT; HOW MUCH IS LEFT:   LAST APPOINTMENT DATE: @7 /05/2020  NEXT APPOINTMENT DATE:@10 /13/2022  DO WE HAVE YOUR PERMISSION TO LEAVE A DETAILED MESSAGE?: yes

## 2020-10-13 ENCOUNTER — Ambulatory Visit: Payer: BC Managed Care – PPO | Admitting: Endocrinology

## 2020-11-02 ENCOUNTER — Ambulatory Visit (INDEPENDENT_AMBULATORY_CARE_PROVIDER_SITE_OTHER): Payer: BC Managed Care – PPO | Admitting: Endocrinology

## 2020-11-02 ENCOUNTER — Other Ambulatory Visit: Payer: Self-pay

## 2020-11-02 VITALS — BP 100/60 | HR 94 | Ht 69.5 in | Wt 160.0 lb

## 2020-11-02 DIAGNOSIS — E10649 Type 1 diabetes mellitus with hypoglycemia without coma: Secondary | ICD-10-CM | POA: Diagnosis not present

## 2020-11-02 DIAGNOSIS — E109 Type 1 diabetes mellitus without complications: Secondary | ICD-10-CM

## 2020-11-02 LAB — POCT GLYCOSYLATED HEMOGLOBIN (HGB A1C): Hemoglobin A1C: 10.3 % — AB (ref 4.0–5.6)

## 2020-11-02 MED ORDER — NOVOLOG FLEXPEN 100 UNIT/ML ~~LOC~~ SOPN: PEN_INJECTOR | SUBCUTANEOUS | 3 refills | Status: DC

## 2020-11-02 MED ORDER — TRESIBA FLEXTOUCH 200 UNIT/ML ~~LOC~~ SOPN
90.0000 [IU] | PEN_INJECTOR | Freq: Every day | SUBCUTANEOUS | 3 refills | Status: DC
Start: 2020-11-02 — End: 2021-01-23

## 2020-11-02 NOTE — Patient Instructions (Addendum)
check your blood sugar 4 times a day: before the 3 meals, and at bedtime.  also check if you have symptoms of your blood sugar being too high or too low.  please keep a record of the readings and bring it to your next appointment here (or you can bring the meter itself).  You can write it on any piece of paper.  please call us sooner if your blood sugar goes below 70, or if you have a lot of readings over 200.   Please increase the Tresiba to 90 units daily.   Please limit the novolog to a total of 40 units per day, and take with breakfast and supper only.   On this type of insulin schedule, you should eat meals on a regular schedule (especially lunch).  If a meal is missed or significantly delayed, your blood sugar could go low.  Also, you should carry crackers or cookies with you, in case your blood sugar goes low.   To avoid the blood sugar being low in the middle of the night, you should eat a snack at bedtime.   Please see a diabetes educator, to consider a pump and continuous glucose monitor.   Please come back for a follow-up appointment in January.

## 2020-11-02 NOTE — Progress Notes (Signed)
Subjective:    Patient ID: Glen Merritt, male    DOB: July 05, 2000, 20 y.o.   MRN: [id number]  HPI Pt is almost 1 hr late today.   Pt returns for f/u of diabetes mellitus:  DM type: 1 Dx'ed: 8889 Complications: none Therapy: insulin since dx DKA: last episode was in 2016 Severe hypoglycemia: last episode was in 2021 Pancreatitis: never.   Pancreatic imaging: never.   Other:  cbg's are lower on work days; he takes multiple daily injections, but plan is to simplify insulin schedule by emphasizing basal.     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   other: he eats 1 meal per day (supper).   Interval history:  He takes [place], 80 units qd, and PRN Novolog, for a total of approx 40 units per day.  He takes this 3 times a day (just before each meal).  no cbg record, but states cbg's vary from 56-430.  It is lowest with exertion, and when a meal is missed.  He has mild hypoglycemia once per day.  This happens in the afternoon.   Past Medical History:  Diagnosis Date   Hypertension    Type 1 diabetes mellitus ([place])    Dx 10/2003.  On a pump in the past    No past surgical history on file.  Social History   Socioeconomic History   Marital status: Single    Spouse name: Not on file   Number of children: Not on file   Years of education: Not on file   Highest education level: Not on file  Occupational History   Not on file  Tobacco Use   Smoking status: Never   Smokeless tobacco: Never  Substance and Sexual Activity   Alcohol use: No   Drug use: Not on file   Sexual activity: Not on file  Other Topics Concern   Not on file  Social History Narrative   Is in 11th grade at [employer]. Lives with Mother and 2 brothers - ages 58,20; [place] pigs in the house; No smokers in the house.   Social Determinants of Health   Financial Resource Strain: Not on file  Food Insecurity: Not on file  Transportation Needs: Not on file   Physical Activity: Not on file  Stress: Not on file  Social Connections: Not on file  Intimate Partner Violence: Not on file    Current Outpatient Medications on File Prior to Visit  Medication Sig Dispense Refill   B-D ULTRAFINE III SHORT PEN 31G X 8 MM MISC USE AS DIRECTED TWICE A DAY 180 each 3   Blood Glucose Monitoring Suppl (ACCU-CHEK GUIDE) w/Device KIT 1 Device by Does not apply route 4 (four) times daily. 1 kit 0   glucagon 1 MG injection Use for Severe Hypoglycemia . Inject 53m intramuscularly if unresponsive, unable to swallow, unconscious and/or has seizure. 2 kit 2   glucose blood (ACCU-CHEK GUIDE) test strip Used to check blood sugars four times daily. 200 each 11   Lancets Misc. (ACCU-CHEK MULTICLIX LANCET DEV) KIT Used to check blood sugars four times daily. 200 each 11   lisinopril (ZESTRIL) 5 MG tablet Take 1 tablet (5 mg total) by mouth daily. 90 tablet 3   No current facility-administered medications on file prior to visit.    No Known Allergies  Family History  Problem Relation Age of Onset   Heart disease Maternal Grandmother  Heart disease Maternal Grandfather    Cancer Maternal Grandfather    Obesity Mother    Diabetes Mother    Hypertension Mother     BP 100/60 (BP Location: Right Arm, Patient Position: Sitting, Cuff Size: Normal)   Pulse 94   Ht 5' 9.5" (1.765 m)   Wt 160 lb (72.6 kg)   SpO2 97%   BMI 23.29 kg/m    Review of Systems     Objective:   Physical Exam VITAL SIGNS:  See vs page.   GENERAL: no distress.  Pulses: dorsalis pedis intact bilat.   MSK: no deformity of the feet CV: no leg edema Skin:  no ulcer on the feet.  normal color and temp on the feet. Neuro: sensation is intact to touch on the feet  Lab Results  Component Value Date   HGBA1C 10.3 (A) 11/02/2020      Assessment & Plan:  Type 1 DM: severe exacerbation.  Hypoglycemia, due to insulin: I told pt we need to carefully monitor for this, especially as we are  increasing insulin today.    Patient Instructions  check your blood sugar 4 times a day: before the 3 meals, and at bedtime.  also check if you have symptoms of your blood sugar being too high or too low.  please keep a record of the readings and bring it to your next appointment here (or you can bring the meter itself).  You can write it on any piece of paper.  please call us sooner if your blood sugar goes below 70, or if you have a lot of readings over 200.   Please increase the Tresiba to 90 units daily.   Please limit the novolog to a total of 40 units per day, and take with breakfast and supper only.   On this type of insulin schedule, you should eat meals on a regular schedule (especially lunch).  If a meal is missed or significantly delayed, your blood sugar could go low.  Also, you should carry crackers or cookies with you, in case your blood sugar goes low.   To avoid the blood sugar being low in the middle of the night, you should eat a snack at bedtime.   Please see a diabetes educator, to consider a pump and continuous glucose monitor.   Please come back for a follow-up appointment in January.

## 2021-01-23 ENCOUNTER — Other Ambulatory Visit: Payer: Self-pay

## 2021-01-23 ENCOUNTER — Ambulatory Visit (INDEPENDENT_AMBULATORY_CARE_PROVIDER_SITE_OTHER): Payer: BC Managed Care – PPO | Admitting: Endocrinology

## 2021-01-23 VITALS — BP 132/64 | HR 103 | Ht 69.5 in | Wt 162.8 lb

## 2021-01-23 DIAGNOSIS — E109 Type 1 diabetes mellitus without complications: Secondary | ICD-10-CM

## 2021-01-23 DIAGNOSIS — E10649 Type 1 diabetes mellitus with hypoglycemia without coma: Secondary | ICD-10-CM

## 2021-01-23 LAB — POCT GLYCOSYLATED HEMOGLOBIN (HGB A1C): Hemoglobin A1C: 10.7 % — AB (ref 4.0–5.6)

## 2021-01-23 MED ORDER — TRESIBA FLEXTOUCH 200 UNIT/ML ~~LOC~~ SOPN: 90.0000 [IU] | PEN_INJECTOR | Freq: Every day | SUBCUTANEOUS | 3 refills | Status: DC

## 2021-01-23 MED ORDER — FREESTYLE LIBRE 2 SENSOR MISC: 1.0000 | 3 refills | Status: DC

## 2021-01-23 MED ORDER — NOVOLOG FLEXPEN 100 UNIT/ML ~~LOC~~ SOPN: PEN_INJECTOR | SUBCUTANEOUS | 3 refills | Status: DC

## 2021-01-23 NOTE — Patient Instructions (Addendum)
check your blood sugar 4 times a day: before the 3 meals, and at bedtime.  also check if you have symptoms of your blood sugar being too high or too low.  please keep a record of the readings and bring it to your next appointment here (or you can bring the meter itself).  You can write it on any piece of paper.  please call us sooner if your blood sugar goes below 70, or if you have a lot of readings over 200.   Please increase the Tresiba to 90 units daily.   Please limit the novolog to a total of 40 units per day, mostly with supper.   On this type of insulin schedule, you should eat meals on a regular schedule (especially lunch).  If a meal is missed or significantly delayed, your blood sugar could go low.  Also, you should carry crackers or cookies with you, in case your blood sugar goes low.   To avoid the blood sugar being low in the middle of the night, you should also eat a snack at bedtime.   I have sent a prescription to your pharmacy, for the continuous glucose monitor sensors.   Please come back for a follow-up appointment in 3 months.

## 2021-01-23 NOTE — Progress Notes (Signed)
Subjective:    Patient ID: Glen Merritt, male    DOB: [birth year], 21 y.o.   MRN: [id number]  HPI Pt returns for f/u of diabetes mellitus:  DM type: 1 Dx'ed: 1062 Complications: none Therapy: insulin since dx DKA: last episode was in 2016 Severe hypoglycemia: last episode was in 2021 Pancreatitis: never.   Pancreatic imaging: never.   Other:  cbg's are lower on work days; he takes multiple daily injections, but plan is to simplify insulin schedule by emphasizing basal.   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   other: he eats 1 meal per day (supper).   Interval history:  He takes [place] just 80 units qd, and PRN Novolog, for a total of approx 60 units per day.  He takes this 3 times a day (just before each meal).  no cbg record, but states cbg's vary from 51-500.  He has mild hypoglycemia once per day.  This happens in the afternoon, due to small/missed meals, and activity.   Past Medical History:  Diagnosis Date   Hypertension    Type 1 diabetes mellitus ([place])    Dx 10/2003.  On a pump in the past    No past surgical history on file.  Social History   Socioeconomic History   Marital status: Single    Spouse name: Not on file   Number of children: Not on file   Years of education: Not on file   Highest education level: Not on file  Occupational History   Not on file  Tobacco Use   Smoking status: Never   Smokeless tobacco: Never  Substance and Sexual Activity   Alcohol use: No   Drug use: Not on file   Sexual activity: Not on file  Other Topics Concern   Not on file  Social History Narrative   Is in 11th grade at [employer]. Lives with Mother and 2 brothers - ages 10,20; [place] pigs in the house; No smokers in the house.   Social Determinants of Health   Financial Resource Strain: Not on file  Food Insecurity: Not on file  Transportation Needs: Not on file  Physical Activity: Not on file  Stress: Not  on file  Social Connections: Not on file  Intimate Partner Violence: Not on file    Current Outpatient Medications on File Prior to Visit  Medication Sig Dispense Refill   B-D ULTRAFINE III SHORT PEN 31G X 8 MM MISC USE AS DIRECTED TWICE A DAY 180 each 3   Blood Glucose Monitoring Suppl (ACCU-CHEK GUIDE) w/Device KIT 1 Device by Does not apply route 4 (four) times daily. 1 kit 0   glucagon 1 MG injection Use for Severe Hypoglycemia . Inject 37m intramuscularly if unresponsive, unable to swallow, unconscious and/or has seizure. 2 kit 2   glucose blood (ACCU-CHEK GUIDE) test strip Used to check blood sugars four times daily. 200 each 11   Lancets Misc. (ACCU-CHEK MULTICLIX LANCET DEV) KIT Used to check blood sugars four times daily. 200 each 11   lisinopril (ZESTRIL) 5 MG tablet Take 1 tablet (5 mg total) by mouth daily. 90 tablet 3   No current facility-administered medications on file prior to visit.    No Known Allergies  Family History  Problem Relation Age of Onset   Heart disease Maternal Grandmother    Heart disease Maternal Grandfather    Cancer Maternal Grandfather    Obesity  Mother    Diabetes Mother    Hypertension Mother     BP 132/64    Pulse (!) 103    Ht 5' 9.5" (1.765 m)    Wt 162 lb 12.8 oz (73.8 kg)    SpO2 98%    BMI 23.70 kg/m    Review of Systems Denies LOC.      Objective:   Physical Exam    Lab Results  Component Value Date   HGBA1C 10.7 (A) 01/23/2021      Assessment & Plan:  Type 1 DM: uncontrolled.   Hypoglycemia, due to insulin.    Patient Instructions  check your blood sugar 4 times a day: before the 3 meals, and at bedtime.  also check if you have symptoms of your blood sugar being too high or too low.  please keep a record of the readings and bring it to your next appointment here (or you can bring the meter itself).  You can write it on any piece of paper.  please call us sooner if your blood sugar goes below 70, or if you have a lot of  readings over 200.   Please increase the Tresiba to 90 units daily.   Please limit the novolog to a total of 40 units per day, mostly with supper.   On this type of insulin schedule, you should eat meals on a regular schedule (especially lunch).  If a meal is missed or significantly delayed, your blood sugar could go low.  Also, you should carry crackers or cookies with you, in case your blood sugar goes low.   To avoid the blood sugar being low in the middle of the night, you should also eat a snack at bedtime.   I have sent a prescription to your pharmacy, for the continuous glucose monitor sensors.   Please come back for a follow-up appointment in 3 months.

## 2021-01-24 ENCOUNTER — Ambulatory Visit: Payer: BC Managed Care – PPO | Admitting: Endocrinology

## 2021-02-21 ENCOUNTER — Telehealth: Payer: Self-pay

## 2021-02-21 ENCOUNTER — Other Ambulatory Visit: Payer: Self-pay

## 2021-02-21 DIAGNOSIS — E109 Type 1 diabetes mellitus without complications: Secondary | ICD-10-CM

## 2021-02-21 MED ORDER — DEXCOM G6 RECEIVER DEVI: Status: DC

## 2021-02-21 MED ORDER — DEXCOM G6 TRANSMITTER MISC: 3 refills | Status: DC

## 2021-02-21 MED ORDER — DEXCOM G6 SENSOR MISC: 3 refills | Status: DC

## 2021-02-21 NOTE — Telephone Encounter (Signed)
LVM for mother Kerby Nora to give me a cb to the office before I sent in any Rx to make sure I have the right clarification on what is being asked for.

## 2021-04-24 ENCOUNTER — Ambulatory Visit: Payer: BC Managed Care – PPO | Admitting: Endocrinology

## 2021-06-14 ENCOUNTER — Telehealth: Payer: Self-pay | Admitting: Pharmacy Technician

## 2021-06-14 ENCOUNTER — Other Ambulatory Visit (HOSPITAL_COMMUNITY): Payer: Self-pay

## 2021-06-14 DIAGNOSIS — E109 Type 1 diabetes mellitus without complications: Secondary | ICD-10-CM

## 2021-06-14 NOTE — Telephone Encounter (Signed)
Received PA request from pharmacy for [facility]. Patient's [employer] plan prefers Humalog Kwikpen. Would MD like to switch to covered alternative? New prescription would need sent to pharmacy.

## 2021-06-19 NOTE — Telephone Encounter (Signed)
Alternative Humalog has now been sent in

## 2021-06-27 ENCOUNTER — Other Ambulatory Visit: Payer: Self-pay

## 2021-06-27 DIAGNOSIS — E109 Type 1 diabetes mellitus without complications: Secondary | ICD-10-CM

## 2021-06-27 MED ORDER — INSULIN LISPRO (1 UNIT DIAL) 100 UNIT/ML (KWIKPEN): PEN_INJECTOR | SUBCUTANEOUS | 3 refills | Status: DC

## 2021-12-01 DIAGNOSIS — Z419 Encounter for procedure for purposes other than remedying health state, unspecified: Secondary | ICD-10-CM | POA: Diagnosis not present

## 2022-01-01 DIAGNOSIS — Z419 Encounter for procedure for purposes other than remedying health state, unspecified: Secondary | ICD-10-CM | POA: Diagnosis not present

## 2022-02-01 DIAGNOSIS — Z419 Encounter for procedure for purposes other than remedying health state, unspecified: Secondary | ICD-10-CM | POA: Diagnosis not present

## 2022-02-08 ENCOUNTER — Ambulatory Visit: Payer: Medicaid Other | Admitting: Internal Medicine

## 2022-02-08 ENCOUNTER — Encounter: Payer: Self-pay | Admitting: Internal Medicine

## 2022-02-08 VITALS — BP 120/80 | HR 119 | Ht 69.5 in | Wt 159.0 lb

## 2022-02-08 DIAGNOSIS — E1065 Type 1 diabetes mellitus with hyperglycemia: Secondary | ICD-10-CM

## 2022-02-08 DIAGNOSIS — E109 Type 1 diabetes mellitus without complications: Secondary | ICD-10-CM

## 2022-02-08 LAB — POCT GLYCOSYLATED HEMOGLOBIN (HGB A1C): Hemoglobin A1C: 10.6 % — AB (ref 4.0–5.6)

## 2022-02-08 MED ORDER — DEXCOM G7 SENSOR MISC: 3.0000 | 4 refills | Status: DC

## 2022-02-08 MED ORDER — GLUCAGON 3 MG/DOSE NA POWD: 3.0000 mg | Freq: Once | NASAL | 11 refills | Status: DC | PRN

## 2022-02-08 NOTE — Progress Notes (Signed)
Patient ID: Glen Merritt, male   DOB: Nov 25, 2000, 22 y.o.   MRN: [id number]  HPI: Glen Merritt is a 22 y.o.-year-old male, returning for follow-up for DM1, diagnosed in 2005, uncontrolled, without long term complications, but with history of DKA and severe hypoglycemia.  He previously saw Dr. Loanne Drilling, last visit 1 year ago.  Since last visit with Dr. Loanne Drilling, he was in admitted to [facility]:  hyperglycemia and DKA.  HbA1c was quite high at that time.  Per review of the ED physician note, patient mentioned that he was not taking his long-acting insulin due to previous hypoglycemia!  At today's visit, he mentions that this was actually a misunderstanding, and he was taking the long-acting insulin at that time...   Reviewed HbA1c levels: 11/05/2021: HbA1c 10.1% Lab Results  Component Value Date   HGBA1C 10.7 (A) 01/23/2021   HGBA1C 10.3 (A) 11/02/2020   HGBA1C 10.5 (A) 07/05/2020   He is not on an insulin pump.  He had this in the past, but he was running track in high school >> inconvenient.  He is on the following regimen: - Tresiba 90 >> 70 units daily - Humalog -up to 40-50 units a day; ICR 1:4 Correction: 5-10 units (no SSI)  Meter: Accu-Chek guide  Pt checks his sugars 2x a day and they are: - am: 170-190, 201 - 2h after b'fast: n/c - before lunch: n/c - 2h after lunch: n/c - before dinner: n/c - 2h after dinner: n/c - bedtime: 140-150 - nighttime:n/c Lowest sugar was 52 at night (took insulin and not eating all the food); he has hypoglycemia awareness at 60.  He does not have a glucagon kit at home.  + Last severe hypoglycemia episode in 2021 (syncope at work - very active, did not eat).  Highest sugar was 300. + Last DKA admission in 11/2021.  Pt's meals are: -light meals  - no CKD, last BUN/creatinine:  11/05/2021: 7/0.93, glucose 234 Lab Results  Component Value Date   BUN 9 11/14/2018   BUN 16 12/31/2017   CREATININE 0.80 11/14/2018   CREATININE 0.60 (L) 12/31/2017   On lisinopril 5 mg daily.  -No HL; last set of lipids: Lab Results  Component Value Date   CHOL 143 04/02/2016   HDL 79 04/02/2016   LDLCALC 39 04/02/2016   TRIG 124 (H) 04/02/2016   CHOLHDL 1.8 04/02/2016   - last eye exam was in 03/2020. No DR reportedly.  - no numbness and tingling in his feet.  Last foot exam 07/2020. He has nerve pain and muscle cramps.  Last TSH: Lab Results  Component Value Date   TSH 1.61 11/14/2018   He also has a history of HTN.  ROS: + see HPI No increased urination, blurry vision, nausea, chest pain.  Past Medical History:  Diagnosis Date   Hypertension    Type 1 diabetes mellitus ([place])    Dx 10/2003.  On a pump in the past   No past surgical history on file. Social History   Socioeconomic History   Marital status: Single    Spouse name: Not on file   Number of children: Not on file   Years of education: Not on file   Highest education level: Not on file  Occupational History   Not on file  Tobacco Use   Smoking status: Never   Smokeless tobacco: Never  Substance and Sexual Activity   Alcohol use: No   Drug use: Not on file   Sexual  activity: Not on file  Other Topics Concern   Not on file  Social History Narrative   Is in 11th grade at [employer]. Lives with Mother and 2 brothers - ages 73,20; [place] pigs in the house; No smokers in the house.   Social Determinants of Health   Financial Resource Strain: Not on file  Food Insecurity: Not on file  Transportation Needs: Not on file  Physical Activity: Not on file  Stress: Not on file  Social Connections: Not on file  Intimate Partner Violence: Not on file   Current Outpatient Medications on File Prior to Visit  Medication Sig Dispense Refill   B-D ULTRAFINE III SHORT PEN 31G X 8 MM MISC USE AS DIRECTED TWICE A DAY 180 each 3   Blood Glucose Monitoring Suppl (ACCU-CHEK GUIDE) w/Device KIT 1 Device by Does not apply route 4 (four) times daily. 1 kit 0   Continuous Blood Gluc  Receiver (DEXCOM G6 RECEIVER) DEVI USE AS DIRECTED 1 each O   Continuous Blood Gluc Sensor (DEXCOM G6 SENSOR) MISC CHANGE EVERY 10 DAYS 9 each 3   Continuous Blood Gluc Sensor (FREESTYLE LIBRE 2 SENSOR) MISC 1 Device by Does not apply route every 14 (fourteen) days. 6 each 3   Continuous Blood Gluc Transmit (DEXCOM G6 TRANSMITTER) MISC CHANGE EVERY 3 MONTHS 1 each 3   glucagon 1 MG injection Use for Severe Hypoglycemia . Inject 68m intramuscularly if unresponsive, unable to swallow, unconscious and/or has seizure. 2 kit 2   glucose blood (ACCU-CHEK GUIDE) test strip Used to check blood sugars four times daily. 200 each 11   insulin degludec (TRESIBA FLEXTOUCH) 200 UNIT/ML FlexTouch Pen Inject 90 Units into the skin daily. 48 mL 3   insulin lispro (HUMALOG KWIKPEN) 100 UNIT/ML KwikPen Use as directed, inject a total of 40 units daily 45 mL 3   Lancets Misc. (ACCU-CHEK MULTICLIX LANCET DEV) KIT Used to check blood sugars four times daily. 200 each 11   lisinopril (ZESTRIL) 5 MG tablet Take 1 tablet (5 mg total) by mouth daily. 90 tablet 3   No current facility-administered medications on file prior to visit.   No Known Allergies Family History  Problem Relation Age of Onset   Heart disease Maternal Grandmother    Heart disease Maternal Grandfather    Cancer Maternal Grandfather    Obesity Mother    Diabetes Mother    Hypertension Mother    PE: BP 120/80 (BP Location: Right Arm, Patient Position: Sitting, Cuff Size: Normal)   Pulse (!) 119   Ht 5' 9.5" (1.765 m)   Wt 159 lb (72.1 kg)   SpO2 98%   BMI 23.14 kg/m  Wt Readings from Last 3 Encounters:  02/08/22 159 lb (72.1 kg)  01/23/21 162 lb 12.8 oz (73.8 kg)  11/02/20 160 lb (72.6 kg)   Constitutional: normal weight, in NAD Eyes:  EOMI, no exophthalmos ENT: no neck masses, no cervical lymphadenopathy Cardiovascular: Tachycardia, RR, No MRG Respiratory: CTA B Musculoskeletal: no deformities Skin:no rashes Neurological: no  tremor with outstretched hands  ASSESSMENT: 1. DM1, uncontrolled, without long-term complications, but with hyperglycemia, DKA, and hypoglycemia  PLAN:  1. Patient with long-standing, uncontrolled DM1.  Recent DKA episode 11/2021 for an unknown reason, but per ED physician due to the fact that he was missing long-acting insulin due to hypoglycemia (pts. mentioned that this is not correct).  HbA1c at that time was high, 10.1%.  At today's visit, HbA1c is 10.6% (even higher). -At today's visit,  we reviewed his blood sugars at home.  These are not as elevated as expected from the HbA1c.  They are usually above target in the morning but at bedtime at night.  Unfortunately, he is not checking sugars between these times of the day.  He is very busy at work.  Not driving now, but will need a DMV form filled out.  We discussed that for me to fill this out, he will need to check his sugars consistently, since hypo or hyperglycemia can put him and other drivers in danger.  I strongly advised him to get the CGM.  He would like to start back on this.  I sent a prescription for the Dexcom G7 CGM to his pharmacy.  I gave her instructions how to link this to our clinic.  If I am able to review his blood sugars in 2 weeks and I see that sugars are improving, I may be able to sign his DMV form. -He is on a very high dose of long-acting insulin, 70 units of Tresiba daily, without obvious reasons for insulin resistance.  My suspicion was initially that he is missing doses but he does not confirm this today.  He mentions that he is taking [place] at all times.  When asked about his insulin to carb ratio, he mentions that this is approximately 1:15.  However, when I asked him to give me examples about how he calculates the bolus, it appears that his insulin to carb ratio was actually ~1:4, which is very strict.  I suspect that he may have some low blood sugars, but these are not obvious whenever he is checking sugars manually.   I am hoping that we can catch them with a CGM.  Since he does not have low blood sugars for now, I did not have reasons to decrease his insulin doses.  However, I advised him to not use as much insulin for correction as he is using now (sometimes more than 10 units) and I gave him a sliding scale. -He definitely needs diabetes education.  I advised him to schedule an appointment with the diabetes educator to learn concepts of calculating his boluses, carb counting, and, upon discussion today, to hopefully start on an insulin pump.  He has been having suboptimal diabetes control for a long time and we reviewed the possible complications.  I absolutely recommended a plan, since he is now not involved in sports so it is not as an inconvenience to him as it was in high school (he apparently was on the [employer] pump). - I suggested to: Patient Instructions  Please look into: - Omnipod 5 - Tandem t:slim x2 These integrate with the Dexcom CGM.  Please schedule an appt with Leonia Reader for diabetes education. [phone].  Try to get the Dexcom G7 CGM.  Please continue: - Tresiba 70 units daily - Humalog  ICR 1:4 Use: Target 130 ISF:  20; max 8-10 correction  Send me a message to review the CGM traces.  Please return in 1.5-2 months.  - Strongly advised him to start checking sugars at different times of the day - check at least 4 times a day, rotating checks - given foot care handout and explained the principles  - given instructions for hypoglycemia management "15-15 rule"  - advised for yearly eye exams - sent glucagon kit Rx to pharmacy -intranasal - advised to get ketone strips - advised to always have Glu tablets with him - advised for a Med-alert bracelet mentioning "  type 1 diabetes mellitus". - given instruction Re: exercising and driving in DM1 (pt instructions) - also, given information about sick day rules - no signs of other autoimmune disorders - Return to clinic in 1.5 to 2  months -he will need labs at that time and also for exam  - Total time spent for the visit: 40 min, in precharting, post charting, reviewing Dr. Cordelia Pen last note, obtaining medical information from the chart and from the pt, reviewing his  previous labs, evaluations, and treatments, reviewing his symptoms, counseling him about his diabetes (please see the discussed topics above), and developing a plan to further treat it; he had a number of questions which I addressed.  Philemon Kingdom, MD PhD [facility] Endocrinology

## 2022-02-08 NOTE — Patient Instructions (Addendum)
Please look into: - Omnipod 5 - Tandem t:slim x2 These integrate with the Dexcom CGM.  Please schedule an appt with Leonia Reader for diabetes education. [phone].  Try to get the Dexcom G7 CGM.  Please continue: - Tresiba 70 units daily - Humalog  ICR 1:4 Use: Target 130 ISF:  20; max 8-10 correction  Send me a message to review the CGM traces.  Please return in 1.5-2 months.   Basic Rules for Patients with Type I Diabetes Mellitus  The American Diabetes Association (ADA) recommended targets: - fasting sugar 80-130 - after meal sugar <180 - HbA1C <7%  Engage in ?150 min moderate exercise per week  Make sure you have ?8h of sleep every night as this helps both blood sugars and your weight.  Always keep a sugar log (not only record in your meter) and bring it to all appointments with [place].  If you are on a pump, know how to access the settings and to modify the parameters.   Remember, you can always call the number on the back of the pump for emergencies related to the pump.  "15-15 rule" for hypoglycemia: if sugars are low, take 15 g of carbs** ("fast sugar" - e.g. 4 glucose tablets, 4 oz orange juice), wait 15 min, then check sugars again. If still <80, repeat. Continue  until your sugars >80, then eat a normal meal.   Teach family members and coworkers to inject glucagon. Have a glucagon set at home and one at work. They should call 911 after using the set.  If you are on a pump, set "insulin on board" time for 5 hours (if your sugars tend to be higher, can use 4 hours).   If you are on a pump, use the "dual wave bolus" setting for high fat foods (e.g. pizza). Start with a setting of 50%-50% (50% instant bolus and 50% prolonged bolus over 3h, for e.g.).    If you are on a pump, make sure the basal daily insulin dose is approximately equal (not larger) to the daily insulin you get from boluses, otherwise you are at risk for hypoglycemia.  Check sugar before  driving. If <100, correct, and only start driving if sugars rise ?100. Check sugar every hour when on a long drive.  Check sugar before exercising. If <100, correct, and only start exercising if sugars rise ?100. Check sugar every hour when on a long exercise routine and 1h after you finished exercising.   If >250, check urine for ketones. If you have moderate-large ketones in urine, do not start exercise. Hydrate yourself with clear liquids and correct the high sugar. Recheck sugars and ketones before attempting to exercise.  Be aware that you might need less insulin when exercising.  *intense, short, exercise bursts can increase your sugars, but  *less intense, longer (>1h), exercise routines can decrease your sugars.  If you are on a pump, you might need to decrease your basal rate by 10% or more (or even disconnect your pump) while you exercise to prevent low sugars. Do not disconnect your pump by more than 3 hours at a time! You also might need to decrease your insulin bolus for the meal prior to your exercise time by 20% or more.  Make sure you have a MedAlert bracelet or pendant mentioning "Type I Diabetes Mellitus". If you have a prior episode of severe hypoglycemia or hypoglycemia unawareness, it should also mention this.  Please do not walk barefoot. Inspect your feet for  sores/cuts and let us know if you have them.  **E.g. of "fast carbs": first choice (15 g):  1 tube glucose gel, GlucoPouch 15, 2 oz glucose liquid second choice (15-16 g):  3 or 4 glucose tablets (best taken  with water), 15 Dextrose Bits chewable third choice (15-20 g):   cup fruit juice,  cup regular soda, 1 cup skim milk,  1 cup sports drink fourth choice (15-20 g):  1 small tube [place] gel (not frosting), 2 tbsp raisins, 1 tbsp table sugar,  candy, jelly beans, gum drops - check package for carb amount   (adapted from: Lenice Pressman. "Insulin therapy and hypoglycemia" Endocrinol Metab Clin N Am 2012,  41: 57-87)  Sick Day Rules for Diabetes  Think S-K-I-L-L:  Sugars:  - if glucose >200, check every 3h and drink sugar free liquids  - if glucose <200, drink carb-containing liquids and recheck 30 min later  - if glucose high, correct with insulin  - if sugars <60, initiate hypoglycemia management (take 15 g of fast carbs and check sugars in 15 min  - repeat until sugars remain >100).  Ketones:  When to check ketones?  When glucose >300 x2 if on insulin injections (>300 x 1 if on insulin pump). When nausea, vomiting, diarrhea, abdominal pain, headache, fever - even if glucose is normal or low - because in this case, you need both glucose and insulin.    - if you have ketone strips for blood >> if ketones are more or equal than 0.6, need to increase insulin - if you have ketone strips for urine >> if ketones are more or equal than "small", need to increase insulin  Insulin: Never skip long acting insulin, even if not eating!   Urine ketones Blood ketones Extra insulin?  no <0.6 no  small 0.6-1.5 Increase dose by 5%  moderate 1.5-3 Increase dose by 10%  large >3 Increase dose by at least 20%   Liquids: - if glucose >200, check every 3h and drink sugar free liquids  - if glucose <200, small sips of carb-containing liquids (e.g. Ginger ale, Gatorade, juice, etc.)  Let us know!   Call us if: Go to ED if: Call your primary care doctor if:  Sugars >300 for >8h Severe abdominal pain Fever >100F for 24h  Moderate to large  urine ketones or blood ketones >1.5 Difficulty breathing Other chronic diseases flaring up  Vomiting and unable to keep liquids down Signs of dehydration

## 2022-02-13 ENCOUNTER — Telehealth: Payer: Self-pay

## 2022-02-13 NOTE — Telephone Encounter (Signed)
Pt's mom called to advise PA needed for Dexcom G7. Message sent to the PA team to submit request.

## 2022-02-18 ENCOUNTER — Encounter: Payer: Self-pay | Admitting: Intensive Care

## 2022-02-18 ENCOUNTER — Emergency Department
Admission: EM | Admit: 2022-02-18 | Discharge: 2022-02-18 | Disposition: A | Discharge status: Home / Self Care | Payer: Medicaid Other | Attending: Emergency Medicine | Admitting: Emergency Medicine

## 2022-02-18 ENCOUNTER — Other Ambulatory Visit: Payer: Self-pay

## 2022-02-18 DIAGNOSIS — E1065 Type 1 diabetes mellitus with hyperglycemia: Secondary | ICD-10-CM | POA: Insufficient documentation

## 2022-02-18 DIAGNOSIS — R5383 Other fatigue: Secondary | ICD-10-CM | POA: Diagnosis present

## 2022-02-18 DIAGNOSIS — R739 Hyperglycemia, unspecified: Secondary | ICD-10-CM

## 2022-02-18 DIAGNOSIS — R531 Weakness: Secondary | ICD-10-CM

## 2022-02-18 DIAGNOSIS — I10 Essential (primary) hypertension: Secondary | ICD-10-CM | POA: Insufficient documentation

## 2022-02-18 LAB — BLOOD GAS, VENOUS
Acid-base deficit: 6.7 mmol/L — ABNORMAL HIGH (ref 0.0–2.0)
Bicarbonate: 17.3 mmol/L — ABNORMAL LOW (ref 20.0–28.0)
O2 Saturation: 91.4 %
Patient temperature: 37
pCO2, Ven: 30 mmHg — ABNORMAL LOW (ref 44–60)
pH, Ven: 7.37 (ref 7.25–7.43)
pO2, Ven: 60 mmHg — ABNORMAL HIGH (ref 32–45)

## 2022-02-18 LAB — BASIC METABOLIC PANEL
Anion gap: 16 — ABNORMAL HIGH (ref 5–15)
Anion gap: 15 (ref 5–15)
BUN: 10 mg/dL (ref 6–20)
BUN: 9 mg/dL (ref 6–20)
CO2: 15 mmol/L — ABNORMAL LOW (ref 22–32)
CO2: 15 mmol/L — ABNORMAL LOW (ref 22–32)
Calcium: 9 mg/dL (ref 8.9–10.3)
Calcium: 7.6 mg/dL — ABNORMAL LOW (ref 8.9–10.3)
Chloride: 101 mmol/L (ref 98–111)
Chloride: 102 mmol/L (ref 98–111)
Creatinine, Ser: 0.86 mg/dL (ref 0.61–1.24)
Creatinine, Ser: 0.84 mg/dL (ref 0.61–1.24)
GFR, Estimated: >60 mL/min (ref >60)
GFR, Estimated: >60 mL/min (ref >60)
Glucose, Bld: 191 mg/dL — ABNORMAL HIGH (ref 70–99)
Glucose, Bld: 186 mg/dL — ABNORMAL HIGH (ref 70–99)
Potassium: 4.2 mmol/L (ref 3.5–5.1)
Potassium: 3.9 mmol/L (ref 3.5–5.1)
Sodium: 132 mmol/L — ABNORMAL LOW (ref 135–145)
Sodium: 132 mmol/L — ABNORMAL LOW (ref 135–145)

## 2022-02-18 LAB — URINALYSIS, ROUTINE W REFLEX MICROSCOPIC
Bacteria, UA: NONE SEEN
Bilirubin Urine: NEGATIVE
Glucose, UA: >=500 mg/dL — AB
Hgb urine dipstick: NEGATIVE
Ketones, ur: 80 mg/dL — AB
Leukocytes,Ua: NEGATIVE
Nitrite: NEGATIVE
Protein, ur: 30 mg/dL — AB
Specific Gravity, Urine: 1.03 (ref 1.005–1.030)
pH: 5 (ref 5.0–8.0)

## 2022-02-18 LAB — CBC
HCT: 44 % (ref 39.0–52.0)
Hemoglobin: 14.6 g/dL (ref 13.0–17.0)
MCH: 31.3 pg (ref 26.0–34.0)
MCHC: 33.2 g/dL (ref 30.0–36.0)
MCV: 94.4 fL (ref 80.0–100.0)
Platelets: 420 10*3/uL — ABNORMAL HIGH (ref 150–400)
RBC: 4.66 MIL/uL (ref 4.22–5.81)
RDW: 11.9 % (ref 11.5–15.5)
WBC: 10.4 10*3/uL (ref 4.0–10.5)
nRBC: 0 % (ref 0.0–0.2)

## 2022-02-18 LAB — CBG MONITORING, ED: Glucose-Capillary: 194 mg/dL — ABNORMAL HIGH (ref 70–99)

## 2022-02-18 MED ORDER — SODIUM CHLORIDE 0.9 % IV BOLUS
1000.0000 mL | Freq: Once | INTRAVENOUS | Status: AC
  Administered 2022-02-18: 1000 mL via INTRAVENOUS

## 2022-02-18 MED ORDER — INSULIN ASPART 100 UNIT/ML IJ SOLN
5.0000 [IU] | Freq: Once | INTRAMUSCULAR | Status: AC
  Administered 2022-02-18: 5 [IU] via INTRAVENOUS
  Filled 2022-02-18: qty 1

## 2022-02-18 NOTE — ED Notes (Signed)
Pt given diet beverage

## 2022-02-18 NOTE — ED Notes (Signed)
Pt given diet beverage.

## 2022-02-18 NOTE — ED Triage Notes (Signed)
Patient c/o fatigue, tachycardia, and loss of appetite X1 week. Reports he is unsure what blood sugar was at home today. Last took insulin this AM.

## 2022-02-18 NOTE — ED Notes (Signed)
Discharge instructions explained to patient and family at this time. Patient and family state they understand and agree.

## 2022-02-18 NOTE — ED Provider Notes (Signed)
[facility] Provider Note    Event Date/Time   First MD Initiated Contact with Patient 02/18/22 1556     (approximate)  History   Chief Complaint: Fatigue and Tachycardia  HPI  Glen Merritt is a 22 y.o. male with a past medical history of hypertension, type 1 diabetes, presents emergency department for feelings of fatigue weakness and concerns for DKA.  According to the patient for the past 2 to 3 days he has been feeling very weak and fatigued, patient states a history of DKA previously to which this feels similar.  Patient denies any nausea or vomiting denies any abdominal pain.  Patient states his blood sugars have been running near 200 at home.  Physical Exam   Triage Vital Signs: ED Triage Vitals [02/18/22 1544]  Enc Vitals Group     BP 133/86     Pulse Rate (!) 131     Resp 20     Temp 98.3 F (36.8 C)     Temp Source Oral     SpO2 100 %     Weight 160 lb (72.6 kg)     Height 5' 9.5" (1.765 m)     Head Circumference      Peak Flow      Pain Score 0     Pain Loc      Pain Edu?      Excl. in [place]?     Most recent vital signs: Vitals:   02/18/22 1544  BP: 133/86  Pulse: (!) 131  Resp: 20  Temp: 98.3 F (36.8 C)  SpO2: 100%    General: Awake, no distress.  CV:  Good peripheral perfusion.  Regular rate and rhythm  Resp:  Normal effort.  Equal breath sounds bilaterally.  Abd:  No distention.  Soft, nontender.  No rebound or guarding.  ED Results / Procedures / Treatments   EKG  EKG viewed and interpreted by myself shows sinus tachycardia 131 bpm with a narrow QRS, normal axis, normal intervals, nonspecific ST changes.  No old EKG for comparison.  MEDICATIONS ORDERED IN ED: Medications - No data to display   IMPRESSION / MDM / [place] / ED COURSE  I reviewed the triage vital signs and the nursing notes.  Patient's presentation is most consistent with acute presentation with potential threat to life or bodily  function.  Patient presents to the emergency department for generalized fatigue weakness blood sugars running around 200.  Patient has a history of DKA in the past which this feels somewhat similar.  Reassuringly patient denies any nausea vomiting abdominal pain recent fever cough congestion or shortness of breath or chest pain.  We will check labs including a VBG we will begin IV hydration and continue to closely monitor.  EKG shows tachycardia with some nonspecific ST changes.  Patient's labs have resulted showing a slight anion gap elevation at 16 indicating possible mild DKA urinalysis shows ketones as well.  Reassuringly patient's VBG however shows a pH of 7.37.  Discussed with the patient we will IV hydrate with 2 L of fluid dose IV insulin and then reassess.  Patient's repeat BMP after 2 L of fluid shows an anion gap down to 15 although not significantly changed from his anion gap of 16.  Patient however states he feels much better and is ready to go home.  I discussed with the patient to drink a significant amount of water over the next 24 to 48 hours and  to closely monitor his blood glucose.  If his blood sugar begins elevating once again or if he is unable to keep down significant fluids he will return to the emergency department.  Patient agreeable to plan of care.  FINAL CLINICAL IMPRESSION(S) / ED DIAGNOSES   Hyperglycemia Weakness   Note:  This document was prepared using Dragon voice recognition software and may include unintentional dictation errors.   Harvest Dark, MD 02/18/22 Drema Halon

## 2022-02-19 ENCOUNTER — Telehealth: Payer: Self-pay | Admitting: Pharmacy Technician

## 2022-02-19 ENCOUNTER — Other Ambulatory Visit (HOSPITAL_COMMUNITY): Payer: Self-pay

## 2022-02-19 NOTE — Telephone Encounter (Signed)
Mychart message sent to pt to verify insurance information.

## 2022-02-19 NOTE — Telephone Encounter (Signed)
-----   Message from Lauralyn Primes, [place] sent at 02/13/2022  1:48 PM EST ----- Regarding: PA Can we get a PA for Dexcom G7 submitted. Thank you.

## 2022-02-19 NOTE — Telephone Encounter (Signed)
Pharmacy Patient Advocate Encounter   Received notification from Staff Msgs/RMA that prior authorization for Dexcom G7 is required/requested.  Per Test Claim:  Primary payer says "Pt not eligible due to non payment of premium. Pt to contact plan." Secondary says to bill primary.   Tried to enter into CoverMyMeds anyway. It said the same thing? [id number]

## 2022-02-26 ENCOUNTER — Other Ambulatory Visit (HOSPITAL_COMMUNITY): Payer: Self-pay

## 2022-03-02 DIAGNOSIS — Z419 Encounter for procedure for purposes other than remedying health state, unspecified: Secondary | ICD-10-CM | POA: Diagnosis not present

## 2022-03-28 ENCOUNTER — Ambulatory Visit: Payer: Commercial Managed Care - HMO | Admitting: Internal Medicine

## 2022-03-28 NOTE — Progress Notes (Deleted)
Patient ID: Glen Merritt, male   DOB: 04/21/2000, 22 y.o.   MRN: [id number]  HPI: Glen Merritt is a 22 y.o.-year-old male, returning for follow-up for DM1, diagnosed in 2005, uncontrolled, without long term complications, but with history of DKA and severe hypoglycemia.  He previously saw Dr. Loanne Merritt, but last visit with me 1.5 months ago.  Interim history: No increased urination, blurry vision, nausea, chest pain. Since last visit, he presented to the hospital with weakness on 02/18/2022.  He was in mild DKA.  Glucose was in the 190s.  He was hydrated but not admitted.  Reviewed history: Before our visit from 02/2022, he was in admitted to [facility]:  hyperglycemia and DKA.  HbA1c was quite high at that time.  Per review of the ED physician note, patient mentioned that he was not taking his long-acting insulin due to previous hypoglycemia!  At today's visit, he mentions that this was actually a misunderstanding, and he was taking the long-acting insulin at that time...   Reviewed HbA1c levels: Lab Results  Component Value Date   HGBA1C 10.6 (A) 02/08/2022   HGBA1C 10.7 (A) 01/23/2021   HGBA1C 10.3 (A) 11/02/2020  11/05/2021: HbA1c 10.1%  He is not on an insulin pump.  He had this in the past, but he was running track in high school >> inconvenient.  He is open to retry pump.  At last visit he was on the following regimen: - Tresiba 90 >> 70 units daily - Humalog -up to 40-50 units a day; ICR 1:4 Correction: 5-10 units (no SSI)  We changed to: - Tresiba 70 units daily - Humalog  ICR 1:4 Target 130 ISF:  20; max 8-10 correction  Meter: Accu-Chek guide  Pt checks his sugars 2x a day and they are: - am: 170-190, 201 - 2h after b'fast: n/c - before lunch: n/c - 2h after lunch: n/c - before dinner: n/c - 2h after dinner: n/c - bedtime: 140-150 - nighttime:n/c Lowest sugar was 52 at night (took insulin and not eating all the food); he has hypoglycemia awareness at 60.  He does not  have a glucagon kit at home.  + Last severe hypoglycemia episode in 2021 (syncope at work - very active, did not eat).  Highest sugar was 300. + Last DKA admission in 11/2021.  - no CKD, last BUN/creatinine:  Lab Results  Component Value Date   BUN 9 02/18/2022   BUN 10 02/18/2022   CREATININE 0.84 02/18/2022   CREATININE 0.86 02/18/2022  On lisinopril 5 mg daily.  -No HL; last set of lipids: Lab Results  Component Value Date   CHOL 143 04/02/2016   HDL 79 04/02/2016   LDLCALC 39 04/02/2016   TRIG 124 (H) 04/02/2016   CHOLHDL 1.8 04/02/2016   - last eye exam was in 03/2020. No DR reportedly.  - no numbness and tingling in his feet.  Last foot exam 07/2020. He has nerve pain and muscle cramps.  Last TSH: Lab Results  Component Value Date   TSH 1.61 11/14/2018   He also has a history of HTN.  ROS: + see HPI  Past Medical History:  Diagnosis Date   Hypertension    Type 1 diabetes mellitus ([place])    Dx 10/2003.  On a pump in the past   No past surgical history on file. Social History   Socioeconomic History   Marital status: Single    Spouse name: Not on file   Number of children: Not  on file   Years of education: Not on file   Highest education level: Not on file  Occupational History   Not on file  Tobacco Use   Smoking status: Never   Smokeless tobacco: Never  Vaping Use   Vaping Use: Every day  Substance and Sexual Activity   Alcohol use: Yes   Drug use: Never   Sexual activity: Not on file  Other Topics Concern   Not on file  Social History Narrative   Is in 11th grade at [employer]. Lives with Mother and 2 brothers - ages 32,20; [place] pigs in the house; No smokers in the house.   Social Determinants of Health   Financial Resource Strain: Not on file  Food Insecurity: Not on file  Transportation Needs: Not on file  Physical Activity: Not on file  Stress: Not on file  Social Connections: Not on file  Intimate Partner Violence: Not on file    Current Outpatient Medications on File Prior to Visit  Medication Sig Dispense Refill   B-D ULTRAFINE III SHORT PEN 31G X 8 MM MISC USE AS DIRECTED TWICE A DAY 180 each 3   Blood Glucose Monitoring Suppl (ACCU-CHEK GUIDE) w/Device KIT 1 Device by Does not apply route 4 (four) times daily. 1 kit 0   Continuous Blood Gluc Receiver (DEXCOM G6 RECEIVER) DEVI USE AS DIRECTED 1 each O   Continuous Blood Gluc Sensor (DEXCOM G7 SENSOR) MISC 3 each by Does not apply route every 30 (thirty) days. Apply 1 sensor every 10 days 9 each 4   Continuous Blood Gluc Transmit (DEXCOM G6 TRANSMITTER) MISC CHANGE EVERY 3 MONTHS 1 each 3   Glucagon 3 MG/DOSE POWD Place 3 mg into the nose once as needed for up to 1 dose. 1 each 11   glucose blood (ACCU-CHEK GUIDE) test strip Used to check blood sugars four times daily. 200 each 11   insulin degludec (TRESIBA FLEXTOUCH) 200 UNIT/ML FlexTouch Pen Inject 90 Units into the skin daily. 48 mL 3   insulin lispro (HUMALOG KWIKPEN) 100 UNIT/ML KwikPen Use as directed, inject a total of 40 units daily 45 mL 3   Lancets Misc. (ACCU-CHEK MULTICLIX LANCET DEV) KIT Used to check blood sugars four times daily. 200 each 11   No current facility-administered medications on file prior to visit.   No Known Allergies Family History  Problem Relation Age of Onset   Heart disease Maternal Grandmother    Heart disease Maternal Grandfather    Cancer Maternal Grandfather    Obesity Mother    Diabetes Mother    Hypertension Mother    PE: There were no vitals taken for this visit. Wt Readings from Last 3 Encounters:  02/18/22 160 lb (72.6 kg)  02/08/22 159 lb (72.1 kg)  01/23/21 162 lb 12.8 oz (73.8 kg)   Constitutional: normal weight, in NAD Eyes:  EOMI, no exophthalmos ENT: no neck masses, no cervical lymphadenopathy Cardiovascular: RRR, No MRG Respiratory: CTA B Musculoskeletal: no deformities Skin:no rashes Neurological: no tremor with outstretched  hands  ASSESSMENT: 1. DM1, uncontrolled, without long-term complications, but with hyperglycemia, DKA, and hypoglycemia  PLAN:  1. Patient with longstanding, uncontrolled, type 1 diabetes.  He had a DKA episode 11/2021 for an unknown reason, but per review of the ED physician note, he was missing long-acting insulin due to hypoglycemia (patient mentioned that this was not correct).  HbA1c at that time was high, at 10.1%.  At last visit, this was even higher, at 10.6%.   -  Reviewing his blood sugars at home, at last visit, they were not as elevated as expected from the HbA1c.  They were above target in the morning but improved at that time.  He was not checking sugars between these times of the day as he was very busy at work.  At last visit, he wanted me to fill out his DMV form, but we discussed about checking sugars consistently so that we can see patterns of hypo or hyperglycemia, as otherwise, he can put himself in other drivers in danger.  I strongly advised him to get the CGM.  He had 1 in the past.  I sent a prescription for the Dexcom G7 CGM to his pharmacy.  His insurance requested to PA, which we filled out.  I did advise him that if I can review the tracings and see that his sugars are fairly well-controlled, I could fill out the DMV form. -At last visit, I did advise him to continue the same dose of Humalog and the Humalog insulin to carb ratio, but we changed his sliding scale.  I strongly advised him to take [place] at all times.  When asked about the insulin to carb ratio, he was telling me that this was approximately 1:15, but when I asked him to give me examples about how he calculates the bolus, it appears that this was actually 1: 4, which was quite strict.  We continued it since he did not appear to have any lows.  At last visit I also referred him to diabetes education to learn concepts of calculating his boluses, carb counting, and hopefully start on an insulin pump.  He had a Cosmo pump  before.  He came off because he was involved in sports and was an inconvenience.  He is open to start back on the pump.  At that time, we discussed about different pumps on the market and I recommended either the OmniPod 5 or the tandem t:slim X2 pump. -At today's visit, - I suggested to: Patient Instructions  Please look into: - Omnipod 5 - Tandem t:slim x2 These integrate with the Dexcom CGM.  Please schedule an appt with Leonia Reader for diabetes education. [phone].  Please continue: - Tresiba 70 units daily - Humalog  ICR 1:4 Target 130 ISF:  20; max 8-10 correction  Please return in 2 months.  - advised to check sugars at different times of the day - 4x a day, rotating check times - advised for yearly eye exams >> he is UTD - return to clinic in 2 months  Philemon Kingdom, MD PhD [facility] Endocrinology

## 2022-04-02 DIAGNOSIS — Z419 Encounter for procedure for purposes other than remedying health state, unspecified: Secondary | ICD-10-CM | POA: Diagnosis not present

## 2022-04-17 ENCOUNTER — Other Ambulatory Visit: Payer: Self-pay

## 2022-04-17 ENCOUNTER — Encounter (HOSPITAL_COMMUNITY): Payer: Self-pay | Admitting: Infectious Diseases

## 2022-04-17 ENCOUNTER — Inpatient Hospital Stay (HOSPITAL_COMMUNITY): Payer: Medicaid Other

## 2022-04-17 ENCOUNTER — Inpatient Hospital Stay (HOSPITAL_COMMUNITY)
Admission: EM | Admit: 2022-04-17 | Discharge: 2022-04-18 | DRG: 638 | Disposition: A | Discharge status: Home / Self Care | Payer: Medicaid Other | Attending: Internal Medicine | Admitting: Internal Medicine

## 2022-04-17 DIAGNOSIS — I10 Essential (primary) hypertension: Secondary | ICD-10-CM | POA: Diagnosis not present

## 2022-04-17 DIAGNOSIS — R000 Tachycardia, unspecified: Secondary | ICD-10-CM | POA: Diagnosis not present

## 2022-04-17 DIAGNOSIS — E109 Type 1 diabetes mellitus without complications: Secondary | ICD-10-CM

## 2022-04-17 DIAGNOSIS — N179 Acute kidney failure, unspecified: Secondary | ICD-10-CM | POA: Diagnosis not present

## 2022-04-17 DIAGNOSIS — Z66 Do not resuscitate: Secondary | ICD-10-CM | POA: Diagnosis present

## 2022-04-17 DIAGNOSIS — E1110 Type 2 diabetes mellitus with ketoacidosis without coma: Secondary | ICD-10-CM | POA: Diagnosis present

## 2022-04-17 DIAGNOSIS — Z8249 Family history of ischemic heart disease and other diseases of the circulatory system: Secondary | ICD-10-CM | POA: Diagnosis not present

## 2022-04-17 DIAGNOSIS — Z79899 Other long term (current) drug therapy: Secondary | ICD-10-CM

## 2022-04-17 DIAGNOSIS — T383X6A Underdosing of insulin and oral hypoglycemic [antidiabetic] drugs, initial encounter: Secondary | ICD-10-CM | POA: Diagnosis not present

## 2022-04-17 DIAGNOSIS — E1010 Type 1 diabetes mellitus with ketoacidosis without coma: Principal | ICD-10-CM

## 2022-04-17 DIAGNOSIS — E1310 Other specified diabetes mellitus with ketoacidosis without coma: Secondary | ICD-10-CM | POA: Diagnosis not present

## 2022-04-17 DIAGNOSIS — D72829 Elevated white blood cell count, unspecified: Secondary | ICD-10-CM | POA: Diagnosis not present

## 2022-04-17 DIAGNOSIS — E860 Dehydration: Secondary | ICD-10-CM | POA: Diagnosis present

## 2022-04-17 DIAGNOSIS — Z794 Long term (current) use of insulin: Secondary | ICD-10-CM | POA: Diagnosis not present

## 2022-04-17 DIAGNOSIS — Z5329 Procedure and treatment not carried out because of patient's decision for other reasons: Secondary | ICD-10-CM | POA: Diagnosis present

## 2022-04-17 HISTORY — DX: Type 2 diabetes mellitus with ketoacidosis without coma: E11.10

## 2022-04-17 LAB — BLOOD GAS, VENOUS
Acid-base deficit: 7.7 mmol/L — ABNORMAL HIGH (ref 0.0–2.0)
Bicarbonate: 16.6 mmol/L — ABNORMAL LOW (ref 20.0–28.0)
Drawn by: 60076
O2 Saturation: 81.6 %
Patient temperature: 36.9
pCO2, Ven: 30 mmHg — ABNORMAL LOW (ref 44–60)
pH, Ven: 7.35 (ref 7.25–7.43)
pO2, Ven: 47 mmHg — ABNORMAL HIGH (ref 32–45)

## 2022-04-17 LAB — I-STAT CHEM 8, ED
BUN: 15 mg/dL (ref 6–20)
Calcium, Ion: 1.13 mmol/L — ABNORMAL LOW (ref 1.15–1.40)
Chloride: 100 mmol/L (ref 98–111)
Creatinine, Ser: 0.8 mg/dL (ref 0.61–1.24)
Glucose, Bld: 401 mg/dL — ABNORMAL HIGH (ref 70–99)
HCT: 52 % (ref 39.0–52.0)
Hemoglobin: 17.7 g/dL — ABNORMAL HIGH (ref 13.0–17.0)
Potassium: 4.8 mmol/L (ref 3.5–5.1)
Sodium: 130 mmol/L — ABNORMAL LOW (ref 135–145)
TCO2: 10 mmol/L — ABNORMAL LOW (ref 22–32)

## 2022-04-17 LAB — BASIC METABOLIC PANEL
Anion gap: 34 — ABNORMAL HIGH (ref 5–15)
Anion gap: 14 (ref 5–15)
Anion gap: 12 (ref 5–15)
BUN: 9 mg/dL (ref 6–20)
BUN: 9 mg/dL (ref 6–20)
BUN: 8 mg/dL (ref 6–20)
CO2: 7 mmol/L — ABNORMAL LOW (ref 22–32)
CO2: 16 mmol/L — ABNORMAL LOW (ref 22–32)
CO2: 18 mmol/L — ABNORMAL LOW (ref 22–32)
Calcium: 9.7 mg/dL (ref 8.9–10.3)
Calcium: 8.7 mg/dL — ABNORMAL LOW (ref 8.9–10.3)
Calcium: 9 mg/dL (ref 8.9–10.3)
Chloride: 88 mmol/L — ABNORMAL LOW (ref 98–111)
Chloride: 103 mmol/L (ref 98–111)
Chloride: 103 mmol/L (ref 98–111)
Creatinine, Ser: 1.77 mg/dL — ABNORMAL HIGH (ref 0.61–1.24)
Creatinine, Ser: 1.13 mg/dL (ref 0.61–1.24)
Creatinine, Ser: 0.99 mg/dL (ref 0.61–1.24)
GFR, Estimated: 54 mL/min — ABNORMAL LOW (ref >60)
GFR, Estimated: >60 mL/min (ref >60)
GFR, Estimated: >60 mL/min (ref >60)
Glucose, Bld: 498 mg/dL — ABNORMAL HIGH (ref 70–99)
Glucose, Bld: 172 mg/dL — ABNORMAL HIGH (ref 70–99)
Glucose, Bld: 153 mg/dL — ABNORMAL HIGH (ref 70–99)
Potassium: 4.7 mmol/L (ref 3.5–5.1)
Potassium: 3.6 mmol/L (ref 3.5–5.1)
Potassium: 3.6 mmol/L (ref 3.5–5.1)
Sodium: 129 mmol/L — ABNORMAL LOW (ref 135–145)
Sodium: 133 mmol/L — ABNORMAL LOW (ref 135–145)
Sodium: 133 mmol/L — ABNORMAL LOW (ref 135–145)

## 2022-04-17 LAB — URINALYSIS, ROUTINE W REFLEX MICROSCOPIC
Bacteria, UA: NONE SEEN
Bilirubin Urine: NEGATIVE
Glucose, UA: >=500 mg/dL — AB
Hgb urine dipstick: NEGATIVE
Ketones, ur: 80 mg/dL — AB
Leukocytes,Ua: NEGATIVE
Nitrite: NEGATIVE
Protein, ur: NEGATIVE mg/dL
Specific Gravity, Urine: 1.026 (ref 1.005–1.030)
pH: 5 (ref 5.0–8.0)

## 2022-04-17 LAB — CBC
HCT: 49.7 % (ref 39.0–52.0)
Hemoglobin: 15.6 g/dL (ref 13.0–17.0)
MCH: 31.7 pg (ref 26.0–34.0)
MCHC: 31.4 g/dL (ref 30.0–36.0)
MCV: 101 fL — ABNORMAL HIGH (ref 80.0–100.0)
Platelets: 391 10*3/uL (ref 150–400)
RBC: 4.92 MIL/uL (ref 4.22–5.81)
RDW: 12.1 % (ref 11.5–15.5)
WBC: 12.5 10*3/uL — ABNORMAL HIGH (ref 4.0–10.5)
nRBC: 0 % (ref 0.0–0.2)

## 2022-04-17 LAB — CBG MONITORING, ED
Glucose-Capillary: 499 mg/dL — ABNORMAL HIGH (ref 70–99)
Glucose-Capillary: 214 mg/dL — ABNORMAL HIGH (ref 70–99)
Glucose-Capillary: 150 mg/dL — ABNORMAL HIGH (ref 70–99)
Glucose-Capillary: 169 mg/dL — ABNORMAL HIGH (ref 70–99)
Glucose-Capillary: 182 mg/dL — ABNORMAL HIGH (ref 70–99)

## 2022-04-17 LAB — GLUCOSE, CAPILLARY
Glucose-Capillary: 122 mg/dL — ABNORMAL HIGH (ref 70–99)
Glucose-Capillary: 224 mg/dL — ABNORMAL HIGH (ref 70–99)
Glucose-Capillary: 234 mg/dL — ABNORMAL HIGH (ref 70–99)

## 2022-04-17 LAB — HIV ANTIBODY (ROUTINE TESTING W REFLEX): HIV Screen 4th Generation wRfx: NONREACTIVE

## 2022-04-17 LAB — BETA-HYDROXYBUTYRIC ACID
Beta-Hydroxybutyric Acid: >8 mmol/L — ABNORMAL HIGH (ref 0.05–0.27)
Beta-Hydroxybutyric Acid: 3.26 mmol/L — ABNORMAL HIGH (ref 0.05–0.27)

## 2022-04-17 MED ORDER — SODIUM CHLORIDE 0.9 % IV BOLUS
1000.0000 mL | Freq: Once | INTRAVENOUS | Status: AC
  Administered 2022-04-17: 1000 mL via INTRAVENOUS

## 2022-04-17 MED ORDER — POTASSIUM CHLORIDE 10 MEQ/100ML IV SOLN
10.0000 meq | INTRAVENOUS | Status: AC
  Administered 2022-04-17 (×2): 10 meq via INTRAVENOUS
  Filled 2022-04-17 (×2): qty 100

## 2022-04-17 MED ORDER — DEXTROSE IN LACTATED RINGERS 5 % IV SOLN: INTRAVENOUS | Status: DC

## 2022-04-17 MED ORDER — ACETAMINOPHEN 325 MG PO TABS: 650.0000 mg | ORAL_TABLET | Freq: Four times a day (QID) | ORAL | Status: DC | PRN

## 2022-04-17 MED ORDER — ENOXAPARIN SODIUM 40 MG/0.4ML IJ SOSY: 40.0000 mg | PREFILLED_SYRINGE | Freq: Every day | INTRAMUSCULAR | Status: DC

## 2022-04-17 MED ORDER — ACETAMINOPHEN 650 MG RE SUPP: 650.0000 mg | Freq: Four times a day (QID) | RECTAL | Status: DC | PRN

## 2022-04-17 MED ORDER — KCL-LACTATED RINGERS-D5W 20 MEQ/L IV SOLN
INTRAVENOUS | Status: DC
  Filled 2022-04-17: qty 1000

## 2022-04-17 MED ORDER — INSULIN REGULAR(HUMAN) IN NACL 100-0.9 UT/100ML-% IV SOLN
INTRAVENOUS | Status: AC
  Administered 2022-04-17: 4.4 [IU]/h via INTRAVENOUS
  Filled 2022-04-17 (×2): qty 100

## 2022-04-17 MED ORDER — LACTATED RINGERS IV BOLUS
20.0000 mL/kg | Freq: Once | INTRAVENOUS | Status: AC
  Administered 2022-04-17: 1452 mL via INTRAVENOUS

## 2022-04-17 MED ORDER — LACTATED RINGERS IV SOLN: INTRAVENOUS | Status: DC

## 2022-04-17 MED ORDER — DEXTROSE 50 % IV SOLN: 0.0000 mL | INTRAVENOUS | Status: DC | PRN

## 2022-04-17 NOTE — Plan of Care (Signed)

## 2022-04-17 NOTE — ED Notes (Signed)
ED TO INPATIENT HANDOFF REPORT  ED Nurse Name and Phone #: Lenell Antu Name/Age/Gender Glen Merritt 22 y.o. male Room/Bed: 016C/016C  Code Status   Code Status: DNR  Home/SNF/Other Home Patient oriented to: self, place, time, and situation Is this baseline? Yes   Triage Complete: Triage complete  Chief Complaint DKA (diabetic ketoacidosis) [E11.10]  Triage Note Pt with hx type one DM and DKA here for eval of hyperglycemia, abdominal pain, and vomiting since yesterday. Last in DKA 2 months ago. Reports compliance with medications and CBG checks.    Allergies No Known Allergies  Level of Care/Admitting Diagnosis ED Disposition     ED Disposition  Admit   Condition  --   Comment  Hospital Area: [facility] [[id number]]  Level of Care: Progressive [102]  Admit to Progressive based on following criteria: GI, ENDOCRINE disease patients with GI bleeding, acute liver failure or pancreatitis, stable with diabetic ketoacidosis or thyrotoxicosis (hypothyroid) state.  May admit patient to [facility] or [facility] if equivalent level of care is available:: No  Covid Evaluation: Confirmed COVID Positive  Diagnosis: DKA (diabetic ketoacidosis) [161096]  Admitting Physician: Ginnie Smart [[id number]]  Attending Physician: Ninetta Lights, JEFFREY C [[id number]]  Certification:: I certify this patient will need inpatient services for at least 2 midnights  Estimated Length of Stay: 2          B Medical/Surgery History Past Medical History:  Diagnosis Date   Hypertension    Type 1 diabetes mellitus (HCC)    Dx 10/2003.  On a pump in the past   No past surgical history on file.   A IV Location/Drains/Wounds Patient Lines/Drains/Airways Status     Active Line/Drains/Airways     Name Placement date Placement time Site Days   Peripheral IV 04/17/22 20 G Right Antecubital 04/17/22  0937  Antecubital  less than 1            Intake/Output Last 24 hours No intake or  output data in the 24 hours ending 04/17/22 1300  Labs/Imaging Results for orders placed or performed during the hospital encounter of 04/17/22 (from the past 48 hour(s))  CBG monitoring, ED     Status: Abnormal   Collection Time: 04/17/22  8:42 AM  Result Value Ref Range   Glucose-Capillary 499 (H) 70 - 99 mg/dL    Comment: Glucose reference range applies only to samples taken after fasting for at least 8 hours.  Urinalysis, Routine w reflex microscopic -Urine, Clean Catch     Status: Abnormal   Collection Time: 04/17/22  8:45 AM  Result Value Ref Range   Color, Urine STRAW (A) YELLOW   APPearance CLEAR CLEAR   Specific Gravity, Urine 1.026 1.005 - 1.030   pH 5.0 5.0 - 8.0   Glucose, UA >=500 (A) NEGATIVE mg/dL   Hgb urine dipstick NEGATIVE NEGATIVE   Bilirubin Urine NEGATIVE NEGATIVE   Ketones, ur 80 (A) NEGATIVE mg/dL   Protein, ur NEGATIVE NEGATIVE mg/dL   Nitrite NEGATIVE NEGATIVE   Leukocytes,Ua NEGATIVE NEGATIVE   RBC / HPF 0-5 0 - 5 RBC/hpf   WBC, UA 0-5 0 - 5 WBC/hpf   Bacteria, UA NONE SEEN NONE SEEN   Squamous Epithelial / HPF 0-5 0 - 5 /HPF   Mucus PRESENT     Comment: Performed at [facility], [address] [place], [place] [address]  Basic metabolic panel     Status: Abnormal   Collection Time: 04/17/22  8:52 AM  Result Value Ref Range   Sodium 129 (L) 135 - 145 mmol/L   Potassium 4.7 3.5 - 5.1 mmol/L   Chloride 88 (L) 98 - 111 mmol/L   CO2 7 (L) 22 - 32 mmol/L   Glucose, Bld 498 (H) 70 - 99 mg/dL    Comment: Glucose reference range applies only to samples taken after fasting for at least 8 hours.   BUN 9 6 - 20 mg/dL   Creatinine, Ser 1.61 (H) 0.61 - 1.24 mg/dL   Calcium 9.7 8.9 - 09.6 mg/dL   GFR, Estimated 54 (L) >60 mL/min    Comment: (NOTE) Calculated using the CKD-EPI Creatinine Equation (2021)    Anion gap 34 (H) 5 - 15    Comment: ELECTROLYTES REPEATED TO VERIFY Performed at [facility], [address], [place], [place] [address]    CBC     Status: Abnormal   Collection Time: 04/17/22  8:52 AM  Result Value Ref Range   WBC 12.5 (H) 4.0 - 10.5 K/uL   RBC 4.92 4.22 - 5.81 MIL/uL   Hemoglobin 15.6 13.0 - 17.0 g/dL   HCT 98.1 19.1 - 47.8 %   MCV 101.0 (H) 80.0 - 100.0 fL   MCH 31.7 26.0 - 34.0 pg   MCHC 31.4 30.0 - 36.0 g/dL   RDW 29.5 62.1 - 30.8 %   Platelets 391 150 - 400 K/uL   nRBC 0.0 0.0 - 0.2 %    Comment: Performed at [facility], [address] [place], [place] [address]  Beta-hydroxybutyric acid     Status: Abnormal   Collection Time: 04/17/22  9:50 AM  Result Value Ref Range   Beta-Hydroxybutyric Acid >8.00 (H) 0.05 - 0.27 mmol/L    Comment: RESULT CONFIRMED BY MANUAL DILUTION Performed at [facility], [address], [place], [place] [address]   I-stat chem 8, ED     Status: Abnormal   Collection Time: 04/17/22 10:06 AM  Result Value Ref Range   Sodium 130 (L) 135 - 145 mmol/L   Potassium 4.8 3.5 - 5.1 mmol/L   Chloride 100 98 - 111 mmol/L   BUN 15 6 - 20 mg/dL   Creatinine, Ser 5.28 0.61 - 1.24 mg/dL   Glucose, Bld 413 (H) 70 - 99 mg/dL    Comment: Glucose reference range applies only to samples taken after fasting for at least 8 hours.   Calcium, Ion 1.13 (L) 1.15 - 1.40 mmol/L   TCO2 10 (L) 22 - 32 mmol/L   Hemoglobin 17.7 (H) 13.0 - 17.0 g/dL   HCT 24.4 01.0 - 27.2 %  CBG monitoring, ED     Status: Abnormal   Collection Time: 04/17/22 10:46 AM  Result Value Ref Range   Glucose-Capillary 214 (H) 70 - 99 mg/dL    Comment: Glucose reference range applies only to samples taken after fasting for at least 8 hours.   DG CHEST PORT 1 VIEW  Result Date: 04/17/2022 CLINICAL DATA:  Diabetic ketoacidosis. EXAM: PORTABLE CHEST 1 VIEW COMPARISON:  June 29, 2007. FINDINGS: The heart size and mediastinal contours are within normal limits. Both lungs are clear. The visualized skeletal structures are unremarkable. IMPRESSION: No active disease. Electronically Signed   By: Lupita Raider  M.D.   On: 04/17/2022 11:38    Pending Labs Unresulted Labs (From admission, onward)     Start     Ordered   04/24/22 0500  Creatinine, serum  (  enoxaparin (LOVENOX)    CrCl >/= 30 ml/min)  Weekly,   R     Comments: while on enoxaparin therapy    04/17/22 1050   04/18/22 0500  CBC  Tomorrow morning,   R        04/17/22 1050   04/17/22 1052  Basic metabolic panel  (Diabetes Ketoacidosis (DKA))  STAT Now then every 4 hours ,   R (with STAT occurrences)      04/17/22 1053   04/17/22 1049  HIV Antibody (routine testing w rflx)  (HIV Antibody (Routine testing w reflex) panel)  Once,   R        04/17/22 1050   04/17/22 1049  CBC  (enoxaparin (LOVENOX)    CrCl >/= 30 ml/min)  Once,   R       Comments: Baseline for enoxaparin therapy IF NOT ALREADY DRAWN.  Notify MD if PLT < 100 K.    04/17/22 1050   04/17/22 1010  Beta-hydroxybutyric acid  (Diabetes Ketoacidosis (DKA))  Now then every 8 hours,   STAT (with URGENT occurrences)      04/17/22 1010            Vitals/Pain Today's Vitals   04/17/22 0842 04/17/22 0843 04/17/22 0945  BP: (!) 167/108  (!) 149/106  Pulse: (!) 140  (!) 130  Resp: (!) 26 (!) 22 19  Temp: 98.2 F (36.8 C)    TempSrc: Oral    SpO2: 100%  100%  Weight:  72.6 kg   Height:   (1.778 m)   PainSc:  4      Isolation Precautions No active isolations  Medications Medications  insulin regular, human (MYXREDLIN) 100 units/ 100 mL infusion (4.4 Units/hr Intravenous New Bag/Given 04/17/22 1112)  lactated ringers infusion ( Intravenous Not Given 04/17/22 1114)  dextrose 5 % in lactated ringers infusion ( Intravenous New Bag/Given 04/17/22 1109)  dextrose 50 % solution 0-50 mL (has no administration in time range)  potassium chloride 10 mEq in 100 mL IVPB (0 mEq Intravenous Stopped 04/17/22 1258)  enoxaparin (LOVENOX) injection 40 mg (has no administration in time range)  acetaminophen (TYLENOL) tablet 650 mg (has no administration in time range)    Or   acetaminophen (TYLENOL) suppository 650 mg (has no administration in time range)  sodium chloride 0.9 % bolus 1,000 mL (0 mLs Intravenous Stopped 04/17/22 1259)  lactated ringers bolus 1,452 mL (0 mLs Intravenous Stopped 04/17/22 1258)    Mobility walks     Focused Assessments     R Recommendations: See Admitting Provider Note  Report given to:   Additional Notes:

## 2022-04-17 NOTE — ED Notes (Signed)
Let RN Sharia Reeve know pt BP 148/91 (109) HR 115 o2 not reading properly

## 2022-04-17 NOTE — Progress Notes (Signed)
Patient refused his BMP to be collected. Attempted to educate patient about the importance of getting his BMP collected and he kept refusing. Patient upset, asking to see the provider. Provider was called and notified of patient's refusal. Provider said he will come and talk to the patient ( Dr. Geraldo Pitter ). Will continue to monitor.

## 2022-04-17 NOTE — Inpatient Diabetes Management (Signed)
Inpatient Diabetes Program Recommendations  AACE/ADA: New Consensus Statement on Inpatient Glycemic Control (2015)  Target Ranges:  Prepandial:   less than 140 mg/dL      Peak postprandial:   less than 180 mg/dL (1-2 hours)      Critically ill patients:  140 - 180 mg/dL   Lab Results  Component Value Date   GLUCAP 182 (H) 04/17/2022   HGBA1C 10.6 (A) 02/08/2022    Review of Glycemic Control  Diabetes history: DM1 Outpatient Diabetes medications: Tresiba 70 units QD, Humalog IRC 1:4, Target 130 with CF of 20 (max 8-10 units correction) Current orders for Inpatient glycemic control: IV insulin per EndoTool for DKA  HgbA1C - 10.6% Endo - [place] - last appt  BHB - 3.26, CO2 - 18  Inpatient Diabetes Program Recommendations:    Continue IV insulin per EndoTool for DKA until criteria met for discontinuation.  When ready to transition to SQ insulin, give Semglee 1-2 hours prior to discontinuation of drip.  Consider:  Semglee 25 units BID  Novolog 0-9 units TID with meals and 0-5 HS  Novolog 8 units TID with meals if eating > 50%  See pt on 4/17 regarding HgbA1C and DKA.  Thank you. Ailene Ards, RD, LDN, CDCES Inpatient Diabetes Coordinator [phone]

## 2022-04-17 NOTE — Progress Notes (Signed)
Patient with BMP to be drawn by lab soon. Phlebotomy was called and stated they will be here soon. Provider was notified.

## 2022-04-17 NOTE — H&P (Signed)
Date: 04/17/2022               Patient Name:  Glen Merritt MRN: [id number]  DOB: June 12, 2000 Age / Sex: 22 y.o., male   PCP: Patient, No Pcp Per         Medical Service: Internal Medicine Teaching Service         Attending Physician: Dr. Dickie La, MD    First Contact: Dr. Karoline Caldwell, MD Pager: [phone]  Second Contact: Dr. Elza Rafter, DO Pager: [phone]       After Hours (After 5p/  First Contact Pager: [phone]  weekends / holidays): Second Contact Pager: [phone]   Chief Complaint: Nausea and vomiting  History of Present Illness:  Glen Merritt is a 22 year old person living with T1DM who presents for palpitations and elevated blood sugars.  Patient reports feeling well prior to trip to [place] between April 12 and 15.  Returned to the [place] yesterday.  States he has not been eating well since his trip as he normally does not eat out.  Has been drinking more water than usual.  Reports decreased appetite with 2 episodes of vomiting yesterday and once today.  Overall reports lack of appetite since this began.  Reports he is been very stressed as he was laid off from his job just prior to his trip.  Sees endocrinologist with [facility] endocrinology.  Has only seen once in February.  He takes Humalog with meals 3 times a day with carb counting.  Estimates use of about 70 units in a day.  He does not take long-acting due to issues with hypoglycemia.  Easily on Tresiba 90 units daily.  Appears endocrinologist was trying to set patient up for pump.  Patient has not had time to follow-up with this.  Last DKA admission in November 2023. Before that did not have DKA for about 7 years. Reports he used to take lisinopril but has not taken this since 2018. He denies fevers, chills, chest pain, shortness of breath, dysuria, hematemesis.  Meds:  Humalog approximately 70 units daily total with meals  Allergies: Allergies as of 04/17/2022   (No Known Allergies)   Past Medical History:   Diagnosis Date   Hypertension    Type 1 diabetes mellitus (HCC)    Dx 10/2003.  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 Lives with girlfriend. Mother and twin live nearby. Alcohol use <1 drink a week. Denies tobacco use. THC vape every other day.   No PCP Endocrinologist De. Carlus Pavlov  Review of Systems: A complete ROS was negative except as per HPI.   Physical Exam: Blood pressure 103/64, pulse (!) 109, temperature 98.4 F (36.9 C), resp. rate 14, height  (1.778 m), weight 72.6 kg, SpO2 100 %. Constitutional: Appears well-developed and well-nourished. No distress.  HENT: Normocephalic and atraumatic, EOMI, conjunctiva normal, dry mucous membranes Cardiovascular: tachycardic, regular rhythm, S1 and S2 present, no murmurs, rubs, gallops.  Distal pulses intact Respiratory: No respiratory distress, no accessory muscle use.  Effort is normal.  Lungs are clear to auscultation bilaterally. GI: Nondistended, soft, nontender to palpation, normal active bowel sounds Musculoskeletal: Normal bulk and tone.  No peripheral edema noted. Neurological: Is alert and oriented x4, no apparent focal deficits noted. Skin: Warm and dry.  No rash, erythema, lesions noted. Psychiatric: Normal mood and affect.  Behavior is normal. Judgment and thought content normal.   EKG: personally reviewed my interpretation is sinus tachycardia, normal axis, T wave abnormalities in III, avF, and V6 unchanged from prior   CXR: personally reviewed my interpretation is lungs are clear, no infiltrations or edema. Normal heart size  Assessment & Plan by Problem: Principal Problem:   DKA (diabetic ketoacidosis)  Diabetic ketoacidosis Type 1 diabetes mellitus Patient presented with palpitations.  Heart rate in the 130s improved to low 100s with IV fluids.  Found to have glucose of 499, corrected sodium 139,  potassium 4.7 bicarb of 7 and gap of 34.  Beta hydroxybutyrate greater than 8.  Urine positive for ketones and glucose.  Patient reports compliance with mealtime Humalog.  Is not taking long-acting insulin due to hypoglycemia in the past.  Recent travel to [place] and eating a little more irregularly.  Mild leukocytosis to 12.5. Does not endorse any infectious symptoms.  EKG without any acute ischemic changes.  Is not taking any long-acting insulin for the last several months.  Has been more stressed due to losing his job recently.  Perhaps stress, lack of long-acting insulin and irregular eating contributed to his DKA. -Started on insulin per Endo tool -check pH with VBG -Continue on IV fluids, received bolus in ED -Monitor BMP every 4 hours and beta hydroxybutyrate every 8 hours, replace K as needed  -Transition to SQ insulin once anion gap closes x2 -monitor I/Os -NPO -Will need to discuss insulin regimen may needed to be on long acting insulin until he is set up for insulin pump  Acute kidney injury Creatinine of 1.77 and BUN of 9.  Likely in the setting of poor p.o. intake and dehydration in the setting of DKA. -Continue IV fluids -Monitor BMP, I's and O's -Avoid nephrotoxins  Hypertension Stay on lisinopril 5 mg daily may have been for hypertension versus kidney protection in the setting of type 1 diabetes.  Do not see elevated urine micro albumin in the past.  Blood pressure initially elevated but now improved to 134/85.  Will continue to monitor BP.  Dispo: Admit patient to Observation with expected length of stay less than 2 midnights.  Signed: Quincy Simmonds, MD 04/17/2022, 2:05 PM  Pager: [phone] After 5pm on weekdays and 1pm on weekends: On Call pager: [phone]

## 2022-04-17 NOTE — ED Provider Notes (Signed)
[employer] EMERGENCY DEPARTMENT AT [facility] Provider Note   CSN: [id number] Arrival date & time: 04/17/22  7829     History  Chief Complaint  Patient presents with   Hyperglycemia    Glen Merritt is a 22 y.o. male who presents with nausea, vomiting, tachycardia, and polydipsia.   Pt states his sxs began about 3 days ago. He states this feels similar to when he had DKA before. He states he has had decreased hunger, increased thirst, and has felt strong palpations with associated shortness of breath. He has nausea with vomiting x2, and some mild upper abd pain. His last episode of DKA was about 2 months ago. He has been taking insulin as prescribed, no missed doses. His BGL at home is usually 150- 180.  He has no other medical problems, takes no medications daily  HPI     Home Medications Prior to Admission medications   Medication Sig Start Date End Date Taking? Authorizing Provider  B-D ULTRAFINE III SHORT PEN 31G X 8 MM MISC USE AS DIRECTED TWICE A DAY 09/28/18   Romero Belling, MD  Blood Glucose Monitoring Suppl (ACCU-CHEK GUIDE) w/Device KIT 1 Device by Does not apply route 4 (four) times daily. 07/11/18   Romero Belling, MD  Continuous Blood Gluc Receiver (DEXCOM G6 RECEIVER) DEVI USE AS DIRECTED 02/21/21   Romero Belling, MD  Continuous Blood Gluc Sensor (DEXCOM G7 SENSOR) MISC 3 each by Does not apply route every 30 (thirty) days. Apply 1 sensor every 10 days 02/08/22   Carlus Pavlov, MD  Continuous Blood Gluc Transmit (DEXCOM G6 TRANSMITTER) MISC CHANGE EVERY 3 MONTHS 02/21/21   Romero Belling, MD  Glucagon 3 MG/DOSE POWD Place 3 mg into the nose once as needed for up to 1 dose. 02/08/22   Carlus Pavlov, MD  glucose blood (ACCU-CHEK GUIDE) test strip Used to check blood sugars four times daily. 05/17/17   Romero Belling, MD  insulin degludec (TRESIBA FLEXTOUCH) 200 UNIT/ML FlexTouch Pen Inject 90 Units into the skin daily. 01/23/21   Romero Belling, MD  insulin  lispro (HUMALOG KWIKPEN) 100 UNIT/ML KwikPen Use as directed, inject a total of 40 units daily 06/27/21   [name], MD  Lancets Misc. (ACCU-CHEK MULTICLIX LANCET DEV) KIT Used to check blood sugars four times daily. 05/17/17   Romero Belling, MD      Allergies    Patient has no known allergies.    Review of Systems   Review of Systems  Respiratory:  Positive for shortness of breath.   Cardiovascular:  Positive for palpitations.  Gastrointestinal:  Positive for abdominal pain, nausea and vomiting.  Endocrine: Positive for polydipsia.  All other systems reviewed and are negative.   Physical Exam Updated Vital Signs BP (!) 149/106   Pulse (!) 130   Temp 98.2 F (36.8 C) (Oral)   Resp 19   Ht 5\' 10"  (1.778 m)   Wt 72.6 kg   SpO2 100%   BMI 22.96 kg/m  Physical Exam Vitals and nursing note reviewed.  Constitutional:      General: He is not in acute distress.    Appearance: Normal appearance.  HENT:     Head: Normocephalic and atraumatic.     Mouth/Throat:     Mouth: Mucous membranes are dry.  Eyes:     Conjunctiva/sclera: Conjunctivae normal.     Pupils: Pupils are equal, round, and reactive to light.  Cardiovascular:     Rate and Rhythm: Regular rhythm. Tachycardia  present.     Pulses: Normal pulses.     Comments: Tachycardic to 130s Pulmonary:     Effort: Pulmonary effort is normal. No respiratory distress.     Breath sounds: Normal breath sounds. No wheezing.     Comments: Speaking in full sentences.  Clear lung sounds in all fields. Abdominal:     General: There is no distension.     Palpations: Abdomen is soft. There is no mass.     Tenderness: There is no abdominal tenderness. There is no guarding or rebound.     Comments: No ttp of the abd. Soft and nondistended  Musculoskeletal:        General: Normal range of motion.     Cervical back: Normal range of motion and neck supple.  Skin:    General: Skin is warm and dry.     Capillary Refill: Capillary refill  takes less than 2 seconds.  Neurological:     Mental Status: He is alert and oriented to person, place, and time.  Psychiatric:        Mood and Affect: Mood and affect normal.        Speech: Speech normal.        Behavior: Behavior normal.     ED Results / Procedures / Treatments   Labs (all labs ordered are listed, but only abnormal results are displayed) Labs Reviewed  BASIC METABOLIC PANEL - Abnormal; Notable for the following components:      Result Value   Sodium 129 (*)    Chloride 88 (*)    CO2 7 (*)    Glucose, Bld 498 (*)    Creatinine, Ser 1.77 (*)    GFR, Estimated 54 (*)    Anion gap 34 (*)    All other components within normal limits  CBC - Abnormal; Notable for the following components:   WBC 12.5 (*)    MCV 101.0 (*)    All other components within normal limits  CBG MONITORING, ED - Abnormal; Notable for the following components:   Glucose-Capillary 499 (*)    All other components within normal limits  I-STAT CHEM 8, ED - Abnormal; Notable for the following components:   Sodium 130 (*)    Glucose, Bld 401 (*)    Calcium, Ion 1.13 (*)    TCO2 10 (*)    Hemoglobin 17.7 (*)    All other components within normal limits  URINALYSIS, ROUTINE W REFLEX MICROSCOPIC  BETA-HYDROXYBUTYRIC ACID  BETA-HYDROXYBUTYRIC ACID  CBG MONITORING, ED    EKG None  Radiology No results found.  Procedures .Critical Care  Performed by: Alveria Apley, PA-C Authorized by: Alveria Apley, PA-C   Critical care provider statement:    Critical care time (minutes):  40   Critical care time was exclusive of:  Separately billable procedures and treating other patients and teaching time   Critical care was necessary to treat or prevent imminent or life-threatening deterioration of the following conditions:  Endocrine crisis   Critical care was time spent personally by me on the following activities:  Blood draw for specimens, development of treatment plan with patient or  surrogate, discussions with consultants, evaluation of patient's response to treatment, examination of patient, obtaining history from patient or surrogate, ordering and performing treatments and interventions, ordering and review of laboratory studies, pulse oximetry, re-evaluation of patient's condition and review of old charts   I assumed direction of critical care for this patient from another provider in my specialty: no  Care discussed with: admitting provider       Medications Ordered in ED Medications  lactated ringers bolus 1,452 mL (has no administration in time range)  insulin regular, human (MYXREDLIN) 100 units/ 100 mL infusion (has no administration in time range)  lactated ringers infusion (has no administration in time range)  dextrose 5 % in lactated ringers infusion (has no administration in time range)  dextrose 50 % solution 0-50 mL (has no administration in time range)  potassium chloride 10 mEq in 100 mL IVPB (has no administration in time range)  sodium chloride 0.9 % bolus 1,000 mL (1,000 mLs Intravenous New Bag/Given 04/17/22 1610)    ED Course/ Medical Decision Making/ A&P                             Medical Decision Making Amount and/or Complexity of Data Reviewed Labs: ordered.  Risk Prescription drug management. Decision regarding hospitalization.    This patient presents to the ED for concern for DKA. This involves a number of treatment options, and is a complaint that carries with it a high risk of complications and morbidity.  The differential diagnosis includes DKA, HHS, underlying infection, metabolic derangement   Co morbidities:  T1DM   Lab Tests:  I ordered, and personally interpreted labs.  The pertinent results include:  hyperglycemia to 499. He has bicarb of 10 and gap of 34. This si c/f DKA    Cardiac Monitoring:  The patient was maintained on a cardiac monitor.  I personally viewed and interpreted the cardiac monitored which  showed an underlying rhythm of: sinus tachycardia   Medicines ordered:  I ordered medication including fluid bolus, insulin, potassium  for DKA Reevaluation of the patient after these medicines showed that the patient improved I have reviewed the patients home medicines and have made adjustments as needed   Critical Interventions:  pt started on insulin gtt for DKA   Consults:  I requested consultation with the internal medicine team,  and discussed lab and imaging findings as well as pertinent plan - they recommend admission to their service.     Reevaluation:  After the interventions noted above, I reevaluated the patient and found that they have :improved. HR is improving.     Disposition:  After consideration of the diagnostic results and the patients response to treatment, I feel that the patent would benefit from admission for DKA. He will be placed on an insulin gtt. As he is alert and oriented, he is appropriate for the floor at this time. Discussed plan with pt, who is agreeable          Final Clinical Impression(s) / ED Diagnoses Final diagnoses:  Diabetic ketoacidosis without coma associated with type 1 diabetes mellitus    Rx / DC Orders ED Discharge Orders     None         Alveria Apley, PA-C 04/17/22 1023    Wynetta Fines, MD 04/17/22 1523

## 2022-04-17 NOTE — ED Triage Notes (Signed)
Pt with hx type one DM and DKA here for eval of hyperglycemia, abdominal pain, and vomiting since yesterday. Last in DKA 2 months ago. Reports compliance with medications and CBG checks.

## 2022-04-18 ENCOUNTER — Telehealth (HOSPITAL_COMMUNITY): Payer: Self-pay | Admitting: Pharmacy Technician

## 2022-04-18 ENCOUNTER — Other Ambulatory Visit (HOSPITAL_COMMUNITY): Payer: Self-pay

## 2022-04-18 DIAGNOSIS — E1010 Type 1 diabetes mellitus with ketoacidosis without coma: Secondary | ICD-10-CM | POA: Diagnosis not present

## 2022-04-18 DIAGNOSIS — Z794 Long term (current) use of insulin: Secondary | ICD-10-CM | POA: Diagnosis not present

## 2022-04-18 LAB — BASIC METABOLIC PANEL
Anion gap: 12 (ref 5–15)
Anion gap: 12 (ref 5–15)
Anion gap: 13 (ref 5–15)
BUN: 6 mg/dL (ref 6–20)
BUN: 8 mg/dL (ref 6–20)
BUN: 9 mg/dL (ref 6–20)
CO2: 17 mmol/L — ABNORMAL LOW (ref 22–32)
CO2: 23 mmol/L (ref 22–32)
CO2: 24 mmol/L (ref 22–32)
Calcium: 9.2 mg/dL (ref 8.9–10.3)
Calcium: 9.6 mg/dL (ref 8.9–10.3)
Calcium: 9.7 mg/dL (ref 8.9–10.3)
Chloride: 100 mmol/L (ref 98–111)
Chloride: 101 mmol/L (ref 98–111)
Chloride: 105 mmol/L (ref 98–111)
Creatinine, Ser: 0.99 mg/dL (ref 0.61–1.24)
Creatinine, Ser: 1.02 mg/dL (ref 0.61–1.24)
Creatinine, Ser: 1.25 mg/dL — ABNORMAL HIGH (ref 0.61–1.24)
GFR, Estimated: >60 mL/min (ref >60)
GFR, Estimated: >60 mL/min (ref >60)
GFR, Estimated: >60 mL/min (ref >60)
Glucose, Bld: 128 mg/dL — ABNORMAL HIGH (ref 70–99)
Glucose, Bld: 187 mg/dL — ABNORMAL HIGH (ref 70–99)
Glucose, Bld: 195 mg/dL — ABNORMAL HIGH (ref 70–99)
Potassium: 3.3 mmol/L — ABNORMAL LOW (ref 3.5–5.1)
Potassium: 3.6 mmol/L (ref 3.5–5.1)
Potassium: 3.6 mmol/L (ref 3.5–5.1)
Sodium: 134 mmol/L — ABNORMAL LOW (ref 135–145)
Sodium: 136 mmol/L (ref 135–145)
Sodium: 137 mmol/L (ref 135–145)

## 2022-04-18 LAB — GLUCOSE, CAPILLARY
Glucose-Capillary: 228 mg/dL — ABNORMAL HIGH (ref 70–99)
Glucose-Capillary: 119 mg/dL — ABNORMAL HIGH (ref 70–99)
Glucose-Capillary: 79 mg/dL (ref 70–99)
Glucose-Capillary: 116 mg/dL — ABNORMAL HIGH (ref 70–99)
Glucose-Capillary: 169 mg/dL — ABNORMAL HIGH (ref 70–99)

## 2022-04-18 LAB — CBC
HCT: 37.2 % — ABNORMAL LOW (ref 39.0–52.0)
Hemoglobin: 12.8 g/dL — ABNORMAL LOW (ref 13.0–17.0)
MCH: 32.6 pg (ref 26.0–34.0)
MCHC: 34.4 g/dL (ref 30.0–36.0)
MCV: 94.7 fL (ref 80.0–100.0)
Platelets: 258 10*3/uL (ref 150–400)
RBC: 3.93 MIL/uL — ABNORMAL LOW (ref 4.22–5.81)
RDW: 12 % (ref 11.5–15.5)
WBC: 9 10*3/uL (ref 4.0–10.5)
nRBC: 0 % (ref 0.0–0.2)

## 2022-04-18 LAB — BETA-HYDROXYBUTYRIC ACID: Beta-Hydroxybutyric Acid: 3.34 mmol/L — ABNORMAL HIGH (ref 0.05–0.27)

## 2022-04-18 MED ORDER — INSULIN ASPART 100 UNIT/ML IJ SOLN
0.0000 [IU] | INTRAMUSCULAR | Status: DC
  Administered 2022-04-18: 3 [IU] via SUBCUTANEOUS

## 2022-04-18 MED ORDER — TRESIBA FLEXTOUCH 200 UNIT/ML ~~LOC~~ SOPN: 26.0000 [IU] | PEN_INJECTOR | Freq: Every day | SUBCUTANEOUS | 3 refills | Status: DC

## 2022-04-18 MED ORDER — INSULIN DETEMIR 100 UNIT/ML ~~LOC~~ SOLN
30.0000 [IU] | Freq: Once | SUBCUTANEOUS | Status: AC
  Administered 2022-04-18: 30 [IU] via SUBCUTANEOUS
  Filled 2022-04-18: qty 0.3

## 2022-04-18 MED ORDER — LACTATED RINGERS IV SOLN: INTRAVENOUS | Status: DC

## 2022-04-18 MED ORDER — INSULIN DETEMIR 100 UNIT/ML ~~LOC~~ SOLN
30.0000 [IU] | SUBCUTANEOUS | Status: DC
  Filled 2022-04-18: qty 0.3

## 2022-04-18 MED ORDER — POTASSIUM CHLORIDE 20 MEQ PO PACK: 40.0000 meq | PACK | Freq: Two times a day (BID) | ORAL | Status: DC

## 2022-04-18 MED ORDER — POTASSIUM CHLORIDE CRYS ER 20 MEQ PO TBCR
40.0000 meq | EXTENDED_RELEASE_TABLET | Freq: Once | ORAL | Status: AC
  Administered 2022-04-18: 40 meq via ORAL
  Filled 2022-04-18: qty 2

## 2022-04-18 MED ORDER — RIVAROXABAN 10 MG PO TABS
10.0000 mg | ORAL_TABLET | Freq: Every day | ORAL | Status: DC
  Administered 2022-04-18: 10 mg via ORAL
  Filled 2022-04-18: qty 1

## 2022-04-18 NOTE — TOC Transition Note (Signed)
Transition of Care [facility]) - CM/SW Discharge Note   Patient Details  Name: Glen Merritt MRN: [id number] Date of Birth: 04-05-2000  Transition of Care [facility]) CM/SW Contact:  Kermit Balo, RN Phone Number: 04/18/2022, 11:20 AM   Clinical Narrative:    Pt is from home with his girlfriend. Pt drives self as needed.  He denies issues with home medications.  No PCP. [facility] is going to pick him up in their clinic.  Pt has transportation home.  SDOH Interventions Today    Flowsheet Row Most Recent Value  SDOH Interventions   Food Insecurity Interventions Inpatient TOC  [pt denies food insecurities]         Final next level of care: Home/Self Care Barriers to Discharge: No Barriers Identified   Patient Goals and CMS Choice      Discharge Placement                         Discharge Plan and Services Additional resources added to the After Visit Summary for                                       Social Determinants of Health (SDOH) Interventions SDOH Screenings   Food Insecurity: Food Insecurity Present (04/17/2022)  Housing: Low Risk  (04/17/2022)  Recent Concern: Housing - Medium Risk (04/17/2022)  Transportation Needs: No Transportation Needs (04/17/2022)  Utilities: Not At Risk (04/17/2022)  Tobacco Use: Low Risk  (04/17/2022)     Readmission Risk Interventions     No data to display

## 2022-04-18 NOTE — Telephone Encounter (Signed)
Patient Advocate Encounter  Prior Authorization for [employer]  has been approved.    PA# [id number] Insurance [facility] Medicaid of [facility] Electronic Prior Authorization Request Form  Effective dates: 04/18/2022 through 10/15/2022      Roland Earl, CPhT Pharmacy Patient Advocate Specialist [facility] Patient Advocate Team Direct Number: [phone]  Fax: [phone]

## 2022-04-18 NOTE — Progress Notes (Signed)
Patient refused his morning labs. Provider was made aware.

## 2022-04-18 NOTE — Discharge Summary (Signed)
Name: Glen Merritt MRN: [id number] DOB: 19-Apr-2000 22 y.o. PCP: Patient, No Pcp Per  Date of Admission: 04/17/2022  8:38 AM Date of Discharge: 04/18/2022 Attending Physician: No att. providers found  Discharge Diagnosis: 1. Principal Problem:   DKA (diabetic ketoacidosis)   Discharge Medications: Allergies as of 04/18/2022   No Known Allergies      Medication List     STOP taking these medications    Dexcom G6 Receiver Devi   Dexcom G6 Transmitter Misc   Dexcom G7 Sensor Misc       TAKE these medications    Accu-Chek Guide w/Device Kit 1 Device by Does not apply route 4 (four) times daily.   Accu-Chek Multiclix Lancet Dev Kit Used to check blood sugars four times daily.   B-D ULTRAFINE III SHORT PEN 31G X 8 MM Misc Generic drug: Insulin Pen Needle USE AS DIRECTED TWICE A DAY   Glucagon 3 MG/DOSE Powd Place 3 mg into the nose once as needed for up to 1 dose.   glucose blood test strip Commonly known as: Accu-Chek Guide Used to check blood sugars four times daily.   insulin lispro 100 UNIT/ML KwikPen Commonly known as: HumaLOG KwikPen Use as directed, inject a total of 40 units daily What changed:  how much to take how to take this when to take this additional instructions   Tresiba FlexTouch 200 UNIT/ML FlexTouch Pen Generic drug: insulin degludec Inject 26 Units into the skin daily. What changed: how much to take        Disposition and follow-up:   Glen Merritt was discharged from [facility] in Stable condition.  At the hospital follow up visit please address:  1.  Type 1 diabetes:  A. Tresiba 26 units + Humalog with meals (1 unit for every 8 g of carbs + correction factor of 25)  B. Ensure follow up with endocrinology and   C. Check CBGs and adjust insulin as needed    2.  Labs / imaging needed at time of follow-up: BMP  3.  Pending labs/ test needing follow-up: none  Follow-up Appointments:  Follow-up  Information     Glen Herrlich, MD .   Contact information: [address] [place] [address] [phone]         Glen Pavlov, MD Follow up.   Specialty: Internal Medicine Why: Call to schedule endocrinology follow up Contact information: [address] [place] [address] [phone]               - Please follow up with your endocrinologist Glen Merritt (please call to schedule an appointment).   Hospital Course by problem list:  DKA Type 1 diabetes 22 yo with T1DM admitted for DKA in the setting of increased stress and altered eating pattern over the days preceding admission, as well as likely inadequate insulin therapy given absence of basal insulin use. Found to have glucose of 499, corrected sodium 139, potassium 4.7 bicarb of 7 and gap of 34.  Beta hydroxybutyrate greater than 8. Glen Merritt was also admitted 11/2021 for DKA and notes from that time indicate he had not been taking any basal insulin. He has not been taking long-acting insulin for about two years. He has [place] available at home but does not use it. Instead, he uses Humalog with ICR three times daily with meals and additional correction insulin for hyperglycemia 0-1x/day.  Was started on Endotool and transitioned off after anion gap  closed x2. Transitioned to 30u of long acting insulin + SSI. CBGs were much improved and the patient was discharged with 26u of Tresiba, 1:8 insulin:carb ratio, + correction factor of 25 (see discharge instructions). He will follow up with [facility] as his new PCP and endocrinology.   Acute kidney injury Creatinine of 1.77 and BUN of 9.  Likely in the setting of poor p.o. intake and dehydration in the setting of DKA. AKI resolved.   Hypertension Previously on lisinopril 5 mg daily for hypertension versus kidney protection in the setting of type 1 diabetes.  Do not see elevated urine micro albumin in the past.  Blood pressure initially elevated  but later improved. Continue to monitor in outpatient setting.      Discharge Exam:   BP 139/80 (BP Location: Left Arm)   Pulse 95   Temp 98.2 F (36.8 C)   Resp 16   Ht  (1.778 m)   Wt 72.6 kg   SpO2 100%   BMI 22.96 kg/m  General: Pleasant, well-appearing young male laying in bed. No acute distress. CV: RRR. No murmurs. No LE edema Pulmonary: Lungs CTAB. Normal effort. No wheezing or rales. Abdominal: Soft, nontender, nondistended. Normal bowel sounds. Extremities: Palpable radial and DP pulses. Normal ROM. Skin: Warm and dry.  Neuro: A&Ox3. Moves all extremities. No focal deficit. Psych: Normal mood and affect   Pertinent Labs, Studies, and Procedures:     Latest Ref Rng & Units 04/18/2022   12:17 AM 04/17/2022   10:06 AM 04/17/2022    8:52 AM  CBC  WBC 4.0 - 10.5 K/uL 9.0   12.5   Hemoglobin 13.0 - 17.0 g/dL 16.1  09.6  04.5   Hematocrit 39.0 - 52.0 % 37.2  52.0  49.7   Platelets 150 - 400 K/uL 258   391        Latest Ref Rng & Units 04/18/2022   11:54 AM 04/18/2022    7:36 AM 04/18/2022   12:17 AM  BMP  Glucose 70 - 99 mg/dL 409  811  914   BUN 6 - 20 mg/dL Creatinine 0.61 - 1.24 mg/dL 7.82  9.56  2.13   Sodium 135 - 145 mmol/L 136  137  134   Potassium 3.5 - 5.1 mmol/L 3.6  3.3  3.6   Chloride 98 - 111 mmol/L 100  101  105   CO2 22 - 32 mmol/L Calcium 8.9 - 10.3 mg/dL 9.7  9.6  9.2     DG CHEST PORT 1 VIEW  IMPRESSION: No active disease.   On: 04/17/2022 11:38   Discharge Instructions:  Call MD for: extreme fatigue, persistent dizziness or light-headedness, persistent nausea and vomiting, temperature > 100.4  Diet: Carb Modified   Dear Glen Merritt,  You were hospitalized for diabetic ketoacidosis (DKA), which you have been hospitalized before for. You received insulin through the IV and then we transitioned you back to the normal subcutaneous insulin that you take at home. We have changed your insulin regimen to hopefully  keep you out of DKA, while also keeping you from dropping your blood sugars too low.  Please take 26 units of Tresiba each night - this is the long acting insulin that your body needs each day  Please change your carb ratio- you should do a 1:8 ratio now, instead of 1:4, so you do not get too much insulin. Inject 1 unit  of humalog for every 8 grams of carbs that you will be eating with each meal.   Your correction factor should also change to 25. Your goal blood sugar is still 130. At night, if your blood sugar is above 130, you should give yourself additional insulin with this correction factor.   For example: If your blood sugar is 130-154 at night, do not give any additional insulin If your blood sugar is 155-179, you should give 1 additional unit of humalog  If your blood sugar is 180-204, you should give 2 additional units of humalog If your blood sugar is 205-229, you should give 3 additional units of humalog If your blood sugar is 230-254, you should give 4 additional units of humalog If your blood sugar is 255-279, you should give 5 additional units of humalog If your blood sugar is 280-304, you should give 6 additional units of humalog If your blood sugar is 305-329, you should give 7 additional units of humalog If your blood sugar is 330-354, you should give 8 additional units of humalog  Do not give more than 8 units of correctional insulin at night.  We also recommend you see your endocrinologist as soon as possible - please call them to make an appointment. We set you up an appointment in our internal medicine clinic, as we will be your new primary care doctors. That appointment is on 4/25 at 8:45 AM on the ground floor of the hospital. If you have any questions or concerns, call our clinic at [phone] or after hours call [phone] and ask for the internal medicine resident on call.  Take care, Dr. Peterson Lombard and Dr. Ned Card   Signed: Chauncey Mann, DO 04/18/2022, 3:15 PM    Pager: [phone]

## 2022-04-18 NOTE — Inpatient Diabetes Management (Addendum)
Inpatient Diabetes Program Recommendations  AACE/ADA: New Consensus Statement on Inpatient Glycemic Control (2015)  Target Ranges:  Prepandial:   less than 140 mg/dL      Peak postprandial:   less than 180 mg/dL (1-2 hours)      Critically ill patients:  140 - 180 mg/dL   Lab Results  Component Value Date   GLUCAP 116 (H) 04/18/2022   HGBA1C 10.6 (A) 02/08/2022    Latest Reference Range & Units 04/18/22 01:56 04/18/22 04:31 04/18/22 06:38 04/18/22 08:18  Glucose-Capillary 70 - 99 mg/dL 161 (H) 096 (H) 79 045 (H)  (H): Data is abnormally high  Diabetes history: DM1 Outpatient Diabetes medications: Tresiba 70 units QD, Humalog IRC 1:4, Target 130 with CF of 20 (max 8-10 units correction) Current orders for Inpatient glycemic control: Levemir 30 units qd, Novolog 0-15 units correction q 4 hrs.  Inpatient Diabetes Program Recommendations:    Please Consider:   -Change Levemir to Semglee 25 units BID   -Novolog 0-9 units TID with meals and 0-5 HS   -Novolog 8 units TID with meals if eating > 50%  Dr. Elvera Lennox reviewed A1c of 10.1 on last office visit on 02/08/22.  Spoke with patient @ bedside and reviewed A1c currently 10.6. Patient states he takes his insulin,but has not been taking correction if he doesn't eat. Reviewed risks of elevated CBGs. Gave patient 2 Libre 3 CGMs for home use per request of Dr. Ned Card.  Thank you, Glen Merritt. Haik Mahoney, RN, MSN, CDE  Diabetes Coordinator Inpatient Glycemic Control Team Team Pager [phone] (8am-5pm) 04/18/2022 9:51 AM

## 2022-04-18 NOTE — Discharge Instructions (Signed)
Dear Glen Merritt,  You were hospitalized for diabetic ketoacidosis (DKA), which you have been hospitalized before for. You received insulin through the IV and then we transitioned you back to the normal subcutaneous insulin that you take at home. We have changed your insulin regimen to hopefully keep you out of DKA, while also keeping you from dropping your blood sugars too low.  Please take 26 units of Tresiba each night - this is the long acting insulin that your body needs each day  Please change your carb ratio- you should do a 1:8 ratio now, instead of 1:4, so you do not get too much insulin. Inject 1 unit of humalog for every 8 grams of carbs that you will be eating with each meal.   Your correction factor should also change to 25. Your goal blood sugar is still 130. At night, if your blood sugar is above 130, you should give yourself additional insulin with this correction factor.   For example: If your blood sugar is 130-154 at night, do not give any additional insulin If your blood sugar is 155-179, you should give 1 additional unit of humalog  If your blood sugar is 180-204, you should give 2 additional units of humalog If your blood sugar is 205-229, you should give 3 additional units of humalog If your blood sugar is 230-254, you should give 4 additional units of humalog If your blood sugar is 255-279, you should give 5 additional units of humalog If your blood sugar is 280-304, you should give 6 additional units of humalog If your blood sugar is 305-329, you should give 7 additional units of humalog If your blood sugar is 330-354, you should give 8 additional units of humalog  Do not give more than 8 units of correctional insulin at night.  We also recommend you see your endocrinologist as soon as possible - please call them to make an appointment. We set you up an appointment in our internal medicine clinic, as we will be your new primary care doctors. That appointment is on 4/25  at 8:45 AM on the ground floor of the hospital. If you have any questions or concerns, call our clinic at [phone] or after hours call [phone] and ask for the internal medicine resident on call.  Take care, Dr. Peterson Lombard and Dr. Ned Card

## 2022-04-18 NOTE — Telephone Encounter (Signed)
Patient Advocate Encounter   Received notification that prior authorization for Dexcom G7 Sensor is required.   PA submitted on 04/18/2022 Key [id number] Insurance [facility] Medicaid of [facility] Electronic Prior Authorization Request Form Status is pending       Glen Merritt, CPhT Pharmacy Patient Advocate Specialist [facility] Pharmacy Patient Advocate Team Direct Number: [phone]  Fax: [phone]

## 2022-04-20 LAB — GLUCOSE, CAPILLARY
Glucose-Capillary: 168 mg/dL — ABNORMAL HIGH (ref 70–99)
Glucose-Capillary: 164 mg/dL — ABNORMAL HIGH (ref 70–99)
Glucose-Capillary: 155 mg/dL — ABNORMAL HIGH (ref 70–99)
Glucose-Capillary: 239 mg/dL — ABNORMAL HIGH (ref 70–99)
Glucose-Capillary: 176 mg/dL — ABNORMAL HIGH (ref 70–99)

## 2022-04-26 ENCOUNTER — Telehealth: Payer: Self-pay

## 2022-04-26 ENCOUNTER — Other Ambulatory Visit: Payer: Self-pay

## 2022-04-26 ENCOUNTER — Ambulatory Visit: Payer: Medicaid Other | Admitting: Student

## 2022-04-26 ENCOUNTER — Encounter: Payer: Self-pay | Admitting: Student

## 2022-04-26 VITALS — BP 117/68 | HR 98 | Temp 97.8°F | Ht 69.0 in | Wt 154.6 lb

## 2022-04-26 DIAGNOSIS — F419 Anxiety disorder, unspecified: Secondary | ICD-10-CM

## 2022-04-26 DIAGNOSIS — Z8639 Personal history of other endocrine, nutritional and metabolic disease: Secondary | ICD-10-CM | POA: Insufficient documentation

## 2022-04-26 DIAGNOSIS — F32A Depression, unspecified: Secondary | ICD-10-CM | POA: Diagnosis not present

## 2022-04-26 DIAGNOSIS — I10 Essential (primary) hypertension: Secondary | ICD-10-CM | POA: Diagnosis not present

## 2022-04-26 DIAGNOSIS — E1010 Type 1 diabetes mellitus with ketoacidosis without coma: Secondary | ICD-10-CM

## 2022-04-26 HISTORY — DX: Personal history of other endocrine, nutritional and metabolic disease: Z86.39

## 2022-04-26 MED ORDER — TRESIBA FLEXTOUCH 200 UNIT/ML ~~LOC~~ SOPN: 30.0000 [IU] | PEN_INJECTOR | Freq: Every day | SUBCUTANEOUS | 3 refills | Status: DC

## 2022-04-26 MED ORDER — SERTRALINE HCL 25 MG PO TABS
25.0000 mg | ORAL_TABLET | Freq: Every day | ORAL | 1 refills | Status: DC
Start: 2022-04-26 — End: 2022-05-23

## 2022-04-26 NOTE — Assessment & Plan Note (Signed)
Patient living with uncontrolled T1DM who has experienced 2 episodes of DKA over the past 5-6 months. He was hospitalized for DKA earlier this month partly due to medication nonadherence. He was following an endocrinologist (now retired) who advised the patient to take meal time insulin along with 70-80u of tresiba. While on this regimen, patient noted several episodes of hypoglycemia and losing consciousness while at work (previous jobs required strenuous activity). Therefore, he stopped taking his tresiba for the past 1-2 years. While hospitalized, he was started on IV insulin for DKA. After resolution of DKA, he was ultimately discharged with tresiba 26u daily and humalog (1u for every 8g of carbs + correction factor of 25). He was also advised to follow up with his current endocrinologist, Dr. Elvera Lennox. Of note, last A1c 2 months ago was 10.6%.  He presents today to establish care. He states that he has been doing generally well at home. His appetite has improved and he is more active again. His fasting CBGs have typically ranged in the 180s-200 range (with a couple of readings in the 300s when he is late in taking his insulin). His mealtime CBGs have remained around the 150s and he typically needs about 15-20u of mealtime insulin. He has a freestyle libre (sample) that he has been using to check his sugar levels.   We discussed need to increase his tresiba given high fasting CBGs. He is in agreement and we agreed to increase to 30u daily. Foot exam performed today. He would like to obtain [facility] G7 for more accurate CBG checks and has previously used a DEXCOM G6 and thus is familiar with this. Prior auth while inpatient was approved for [facility] G7. Will speak with Lupita Leash prior to providing this for patient. For now, he still has the freestyle libre sample on his arm, which will work for another week and has another sample as well just in case.   Plan: -increase tresiba to 30u daily -continue meal-time at  1u for every 8g of carb with correction factor of 25 -DEXCOM G7 supplies after speaking with Lupita Leash -f/u in 1 month for repeat A1c -reached out to Dr. Elvera Lennox (Endo) to reschedule follow up appt

## 2022-04-26 NOTE — Assessment & Plan Note (Signed)
Patient does note depression and anxiety in regards to ongoing medical conditions and job situation. He was recently laid off of work and subsequently was hospitalized with DKA. He is worried about his health and becomes anxious while thinking of this. PHQ-9 of 14 and GAD-7 of 11, indicative of moderate depression and moderate anxiety. We discussed initiation of SSRI therapy for management and he is in agreement. Will start zoloft  daily for 1 month, then up-titrate if needed at follow up visit.  Plan: -start zoloft  daily -f/u in 1 month, up-titrate if needed

## 2022-04-26 NOTE — Assessment & Plan Note (Addendum)
Patient previously noted to have HTN and was on lisinopril  daily. Unclear if this was for HTN or for nephroprotective effects given T1DM. He has not been on lisinopril for a while. BP in clinic today is 117/68. Will avoid starting antihypertensive therapy and monitor clinically. Microalbumin to creatinine ratio in 2018 without any proteinuria noted.

## 2022-04-26 NOTE — Patient Instructions (Signed)
Mr. Achterberg,  It was a pleasure seeing you in the clinic today.   I have increased your tresiba to 30 units daily. I have prescribed a medication called zoloft to help with anxiety and depression. Please take this once daily for the next month (it takes 4-6 weeks to fully take effect). I have referred you to Lupita Leash (our diabetes counselor) so she can show you how to operate the [facility] G7 before we prescribe it. Please reach out to your endocrinologist to schedule an appointment. Please follow up with [place] in 1 month for your next visit.  Please call our clinic at [phone] if you have any questions or concerns. The best time to call is Monday-[place] from 9am-4pm, but there is someone available 24/7 at the same number. If you need medication refills, please notify your pharmacy one week in advance and they will send [place] a request.   Thank you for letting us take part in your care. We look forward to seeing you next time!

## 2022-04-26 NOTE — Telephone Encounter (Signed)
Decision:Approved [name] Barfuss (KeyMila Homer) PA Case ID #: [id number] Rx #: [id number] Need Help? Call us at [phone] Outcome Approved today Approved. This drug has been approved. Approved quantity: 45 pens per 30 day(s). You may fill up to a 34 day supply at a retail pharmacy. You may fill up to a 90 day supply for maintenance drugs, please refer to the formulary for details. Please call the pharmacy to process your prescription claim. Authorization Expiration Date: 04/26/2023 Drug Insulin Degludec FlexTouch 200UNIT/ML pen-injectors ePA cloud logo Form [place] Medicaid of [employer] Electronic Prior Authorization Request Form [id number] NCPDP) Original Claim Info 75 Submit 3 DS For Emerg Fill with PA Type01, PA Number [id number], Level of Service 3  Approval has been faxed to the pharmacy.

## 2022-04-26 NOTE — Telephone Encounter (Signed)
Prior Authorization for patient (Insulin Degludec FlexTouch) came through on cover my meds was submitted with last office notes and labs awaiting approval or denial

## 2022-04-26 NOTE — Progress Notes (Signed)
CC: hospital f/u visit  HPI:  [name] is a 22 y.o. male with history listed below presenting to the [facility] for hospital f/u visit. Please see individualized problem based charting for full HPI.  Past Medical History:  Diagnosis Date   DKA (diabetic ketoacidosis) 04/17/2022   Hypertension    Ketonuria 09/29/2014   Type 1 diabetes mellitus    Dx 10/2003.  On a pump in the past   Current Outpatient Medications on File Prior to Visit  Medication Sig Dispense Refill   B-D ULTRAFINE III SHORT PEN 31G X 8 MM MISC USE AS DIRECTED TWICE A DAY 180 each 3   Blood Glucose Monitoring Suppl (ACCU-CHEK GUIDE) w/Device KIT 1 Device by Does not apply route 4 (four) times daily. 1 kit 0   Glucagon 3 MG/DOSE POWD Place 3 mg into the nose once as needed for up to 1 dose. 1 each 11   glucose blood (ACCU-CHEK GUIDE) test strip Used to check blood sugars four times daily. 200 each 11   insulin degludec (TRESIBA FLEXTOUCH) 200 UNIT/ML FlexTouch Pen Inject 26 Units into the skin daily. 48 mL 3   insulin lispro (HUMALOG KWIKPEN) 100 UNIT/ML KwikPen Use as directed, inject a total of 40 units daily (Patient taking differently: Inject 78 Units into the skin daily.) 45 mL 3   Lancets Misc. (ACCU-CHEK MULTICLIX LANCET DEV) KIT Used to check blood sugars four times daily. 200 each 11   No current facility-administered medications on file prior to visit.    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  Physical Exam:  Vitals:   04/26/22 0922  BP: 117/68  Pulse: 98  Temp: 97.8 F (36.6 C)  TempSrc: Oral  SpO2: 100%  Weight: 154 lb 9.6 oz (70.1  kg)  Height:  (1.753 m)   Physical Exam Constitutional:      Appearance: Normal appearance. He is normal weight. He is not ill-appearing.  HENT:     Mouth/Throat:     Mouth: Mucous membranes are moist.     Pharynx: Oropharynx is clear. No oropharyngeal exudate.  Eyes:     General: No scleral icterus.    Extraocular Movements: Extraocular movements intact.     Conjunctiva/sclera: Conjunctivae normal.     Pupils: Pupils are equal, round, and reactive to light.  Cardiovascular:     Rate and Rhythm: Normal rate and regular rhythm.     Pulses: Normal pulses.     Heart sounds: Normal heart sounds. No murmur heard.    No friction rub. No gallop.  Pulmonary:     Effort: Pulmonary effort is normal.     Breath sounds: Normal breath sounds. No wheezing, rhonchi or rales.  Abdominal:     General: Bowel sounds are normal. There is no distension.     Palpations: Abdomen is soft.     Tenderness: There is no abdominal tenderness. There is no guarding or rebound.  Musculoskeletal:        General: No swelling. Normal range of motion.  Skin:    General: Skin is warm and dry.     Findings: No lesion or rash.  Neurological:  General: No focal deficit present.     Mental Status: He is alert and oriented to person, place, and time.  Psychiatric:        Mood and Affect: Mood normal.        Behavior: Behavior normal.      Assessment & Plan:   See Encounters Tab for problem based charting.  Patient discussed with Dr.  Sol Blazing

## 2022-04-30 NOTE — Progress Notes (Signed)
Internal Medicine Clinic Attending  Case discussed with Dr. Jinwala  At the time of the visit.  We reviewed the resident's history and exam and pertinent patient test results.  I agree with the assessment, diagnosis, and plan of care documented in the resident's note.  

## 2022-05-01 MED ORDER — DEXCOM G7 SENSOR MISC
1 refills | Status: DC
Start: 2022-05-01 — End: 2022-07-06

## 2022-05-01 NOTE — Addendum Note (Signed)
Addended by: Merrilyn Puma on: 05/01/2022 04:17 PM   Modules accepted: Orders

## 2022-05-02 ENCOUNTER — Other Ambulatory Visit: Payer: Self-pay | Admitting: Student

## 2022-05-02 DIAGNOSIS — E1010 Type 1 diabetes mellitus with ketoacidosis without coma: Secondary | ICD-10-CM

## 2022-05-02 DIAGNOSIS — Z8639 Personal history of other endocrine, nutritional and metabolic disease: Secondary | ICD-10-CM

## 2022-05-02 DIAGNOSIS — Z419 Encounter for procedure for purposes other than remedying health state, unspecified: Secondary | ICD-10-CM | POA: Diagnosis not present

## 2022-05-03 NOTE — Telephone Encounter (Signed)
Called and left another message for pt to call back to schedule a follow up appointment.

## 2022-05-07 NOTE — Telephone Encounter (Signed)
See PA dated 04/18/22 that Dexcom G7 was approved for 1 year by his insurance company.  Call to pharmacy- patient has already picked the sensors up. ([facility] was aware of the approved PA.)

## 2022-05-19 ENCOUNTER — Other Ambulatory Visit: Payer: Self-pay | Admitting: Student

## 2022-05-19 DIAGNOSIS — F419 Anxiety disorder, unspecified: Secondary | ICD-10-CM

## 2022-05-21 ENCOUNTER — Other Ambulatory Visit: Payer: Self-pay | Admitting: *Deleted

## 2022-05-24 ENCOUNTER — Encounter: Payer: Self-pay | Admitting: Dietician

## 2022-05-25 ENCOUNTER — Encounter: Payer: Medicaid Other | Admitting: Student

## 2022-06-02 DIAGNOSIS — Z419 Encounter for procedure for purposes other than remedying health state, unspecified: Secondary | ICD-10-CM | POA: Diagnosis not present

## 2022-06-30 ENCOUNTER — Observation Stay (HOSPITAL_COMMUNITY)
Admission: EM | Admit: 2022-06-30 | Discharge: 2022-06-30 | Discharge status: Against Medical Advice | Payer: Medicaid Other | Attending: Internal Medicine | Admitting: Internal Medicine

## 2022-06-30 ENCOUNTER — Encounter (HOSPITAL_COMMUNITY): Payer: Self-pay

## 2022-06-30 ENCOUNTER — Other Ambulatory Visit: Payer: Self-pay

## 2022-06-30 ENCOUNTER — Other Ambulatory Visit: Payer: Self-pay | Admitting: Endocrinology

## 2022-06-30 DIAGNOSIS — I10 Essential (primary) hypertension: Secondary | ICD-10-CM | POA: Insufficient documentation

## 2022-06-30 DIAGNOSIS — R000 Tachycardia, unspecified: Secondary | ICD-10-CM

## 2022-06-30 DIAGNOSIS — Z79899 Other long term (current) drug therapy: Secondary | ICD-10-CM | POA: Diagnosis not present

## 2022-06-30 DIAGNOSIS — R42 Dizziness and giddiness: Secondary | ICD-10-CM

## 2022-06-30 DIAGNOSIS — E109 Type 1 diabetes mellitus without complications: Secondary | ICD-10-CM

## 2022-06-30 DIAGNOSIS — E1110 Type 2 diabetes mellitus with ketoacidosis without coma: Secondary | ICD-10-CM | POA: Diagnosis present

## 2022-06-30 DIAGNOSIS — E1010 Type 1 diabetes mellitus with ketoacidosis without coma: Principal | ICD-10-CM | POA: Insufficient documentation

## 2022-06-30 DIAGNOSIS — E1065 Type 1 diabetes mellitus with hyperglycemia: Secondary | ICD-10-CM | POA: Insufficient documentation

## 2022-06-30 DIAGNOSIS — N179 Acute kidney failure, unspecified: Secondary | ICD-10-CM

## 2022-06-30 DIAGNOSIS — Z794 Long term (current) use of insulin: Secondary | ICD-10-CM | POA: Diagnosis not present

## 2022-06-30 DIAGNOSIS — F32A Depression, unspecified: Secondary | ICD-10-CM | POA: Diagnosis not present

## 2022-06-30 LAB — BASIC METABOLIC PANEL
Anion gap: 24 — ABNORMAL HIGH (ref 5–15)
Anion gap: 12 (ref 5–15)
BUN: 10 mg/dL (ref 6–20)
BUN: 8 mg/dL (ref 6–20)
CO2: 9 mmol/L — ABNORMAL LOW (ref 22–32)
CO2: 17 mmol/L — ABNORMAL LOW (ref 22–32)
Calcium: 9.5 mg/dL (ref 8.9–10.3)
Calcium: 9 mg/dL (ref 8.9–10.3)
Chloride: 94 mmol/L — ABNORMAL LOW (ref 98–111)
Chloride: 102 mmol/L (ref 98–111)
Creatinine, Ser: 1.52 mg/dL — ABNORMAL HIGH (ref 0.61–1.24)
Creatinine, Ser: 1.2 mg/dL (ref 0.61–1.24)
GFR, Estimated: >60 mL/min (ref >60)
GFR, Estimated: >60 mL/min (ref >60)
Glucose, Bld: 506 mg/dL (ref 70–99)
Glucose, Bld: 166 mg/dL — ABNORMAL HIGH (ref 70–99)
Potassium: 4.3 mmol/L (ref 3.5–5.1)
Potassium: 4 mmol/L (ref 3.5–5.1)
Sodium: 127 mmol/L — ABNORMAL LOW (ref 135–145)
Sodium: 131 mmol/L — ABNORMAL LOW (ref 135–145)

## 2022-06-30 LAB — CBC
HCT: 42.9 % (ref 39.0–52.0)
Hemoglobin: 14 g/dL (ref 13.0–17.0)
MCH: 32.2 pg (ref 26.0–34.0)
MCHC: 32.6 g/dL (ref 30.0–36.0)
MCV: 98.6 fL (ref 80.0–100.0)
Platelets: 335 10*3/uL (ref 150–400)
RBC: 4.35 MIL/uL (ref 4.22–5.81)
RDW: 11.6 % (ref 11.5–15.5)
WBC: 9.4 10*3/uL (ref 4.0–10.5)
nRBC: 0 % (ref 0.0–0.2)

## 2022-06-30 LAB — URINALYSIS, ROUTINE W REFLEX MICROSCOPIC
Bacteria, UA: NONE SEEN
Bilirubin Urine: NEGATIVE
Glucose, UA: >=500 mg/dL — AB
Hgb urine dipstick: NEGATIVE
Ketones, ur: 80 mg/dL — AB
Leukocytes,Ua: NEGATIVE
Nitrite: NEGATIVE
Protein, ur: NEGATIVE mg/dL
Specific Gravity, Urine: 1.027 (ref 1.005–1.030)
pH: 5 (ref 5.0–8.0)

## 2022-06-30 LAB — CBG MONITORING, ED
Glucose-Capillary: 511 mg/dL (ref 70–99)
Glucose-Capillary: 156 mg/dL — ABNORMAL HIGH (ref 70–99)
Glucose-Capillary: 120 mg/dL — ABNORMAL HIGH (ref 70–99)
Glucose-Capillary: 129 mg/dL — ABNORMAL HIGH (ref 70–99)

## 2022-06-30 LAB — GLUCOSE, CAPILLARY
Glucose-Capillary: 162 mg/dL — ABNORMAL HIGH (ref 70–99)
Glucose-Capillary: 159 mg/dL — ABNORMAL HIGH (ref 70–99)
Glucose-Capillary: 121 mg/dL — ABNORMAL HIGH (ref 70–99)

## 2022-06-30 LAB — BETA-HYDROXYBUTYRIC ACID: Beta-Hydroxybutyric Acid: 4.35 mmol/L — ABNORMAL HIGH (ref 0.05–0.27)

## 2022-06-30 MED ORDER — DEXTROSE 50 % IV SOLN: 0.0000 mL | INTRAVENOUS | Status: DC | PRN

## 2022-06-30 MED ORDER — LACTATED RINGERS IV SOLN: INTRAVENOUS | Status: DC

## 2022-06-30 MED ORDER — SERTRALINE HCL 25 MG PO TABS: 25.0000 mg | ORAL_TABLET | Freq: Every day | ORAL | Status: DC

## 2022-06-30 MED ORDER — POTASSIUM CHLORIDE 10 MEQ/100ML IV SOLN
10.0000 meq | INTRAVENOUS | Status: AC
  Administered 2022-06-30 (×2): 10 meq via INTRAVENOUS
  Filled 2022-06-30: qty 100

## 2022-06-30 MED ORDER — DEXTROSE IN LACTATED RINGERS 5 % IV SOLN: INTRAVENOUS | Status: DC

## 2022-06-30 MED ORDER — LACTATED RINGERS IV BOLUS
20.0000 mL/kg | Freq: Once | INTRAVENOUS | Status: AC
  Administered 2022-06-30: 1452 mL via INTRAVENOUS

## 2022-06-30 MED ORDER — INSULIN REGULAR(HUMAN) IN NACL 100-0.9 UT/100ML-% IV SOLN
INTRAVENOUS | Status: DC
  Administered 2022-06-30: 2.2 [IU]/h via INTRAVENOUS
  Filled 2022-06-30: qty 100

## 2022-06-30 MED ORDER — ENOXAPARIN SODIUM 40 MG/0.4ML IJ SOSY
40.0000 mg | PREFILLED_SYRINGE | INTRAMUSCULAR | Status: DC
Start: 2022-06-30 — End: 2022-06-30

## 2022-06-30 NOTE — Progress Notes (Signed)
Patient left AMA.  I spoke in length with patient about risks and that he really needed to stay to stabilize his cbg's overnight.  Then, I had Desiray, charge nurse, also speak with patient in length and she got the same response from patient that he knows his body and that he has been in DKA before and knows what to do.  We explained that he was just here in April for DKA and the importance of staying overnight and pt still refused.  Dr. Lajuana Ripple also came to bedside to speak with pt about the risks of leaving and the benefit of staying overnight to complete medication plan.  Patient refused.  Patient signed AMA sheet and we had IV's removed.  Patient has left unit AMA.

## 2022-06-30 NOTE — Discharge Summary (Signed)
Physician Discharge Summary  Glen Merritt YQM:[id number] DOB: 05/25/2000 DOA: 06/30/2022  PCP: Rana Snare, DO  Admit date: 06/30/2022 Discharge date: 06/30/2022 Consultations: None Admitted From: Home Disposition: Signing out AMA  Discharge Diagnoses:  Active Problems:   DKA (diabetic ketoacidosis) [facility])   Hospital Course Summary: Please see H&P from earlier today.  22 year old male admitted for DKA with bicarb 9 and anion gap 24.  Patient received insulin drip for few hours now.  Repeat BMP at 4 PM shows improvement of bicarb to 17 and anion gap now at 12.  Pseudohyponatremia improved from 127-1 31 with glucose improvement from 506-166. I was summoned by bedside nurse that patient now again insisting on leaving AMA.  I had discussed with him prior to admission regarding overnight admission and he had agreed.  Now patient again wants to leave AMA.  I did explain to him that his labs look better but bicarb still at 17 and that ideally we would like to give subcu insulin 1 hour before discontinuing the insulin drip.  Patient however insists on leaving and states he does not want to wait longer and that he feels fine to go home and take his scheduled Tresiba evening dose.  He was advised of risks of recurrent acidosis, electrolyte imbalances and recurrent hyperglycemia without proper transition to subcu insulin and subsequent risk of arrhythmias and possibility of lethal complications.  He verbalized understanding and stated he would return to ED if his blood glucose went up high or he has recurrence of presenting symptoms.  He also plans to see his primary endocrinologist on Monday for follow-up.  Patient is signing out AMA.  Bedside nurse aware.  Discharge Exam:   Vitals:   06/30/22 1200 06/30/22 1337 06/30/22 1455 06/30/22 1550  BP:    (!) 139/91  Pulse:    93  Resp:    18  Temp:   98 F (36.7 C) 98.4 F (36.9 C)  TempSrc:   Oral Oral  SpO2:    99%  Weight: 72.6 kg 72.6 kg     Height: 5\' 9"  (1.753 m)       General: Pt is alert, awake, not in acute distress Cardiovascular: RRR, S1/S2 +, no rubs, no gallops Respiratory: CTA bilaterally, no wheezing, no rhonchi Abdominal: Soft, NT, ND, bowel sounds + Extremities: no edema, no cyanosis  Discharge Condition:Stable Diet recommendation: Labs: BNP (last 3 results) No results for input(s): "BNP" in the last 8760 hours. Basic Metabolic Panel: Recent Labs  Lab 06/30/22 1100 06/30/22 1612  NA 127* 131*  K 4.3 4.0  CL 94* 102  CO2 9* 17*  GLUCOSE 506* 166*  BUN 10 8  CREATININE 1.52* 1.20  CALCIUM 9.5 9.0   Liver Function Tests: No results for input(s): "AST", "ALT", "ALKPHOS", "BILITOT", "PROT", "ALBUMIN" in the last 168 hours. No results for input(s): "LIPASE", "AMYLASE" in the last 168 hours. No results for input(s): "AMMONIA" in the last 168 hours. CBC: Recent Labs  Lab 06/30/22 1100  WBC 9.4  HGB 14.0  HCT 42.9  MCV 98.6  PLT 335   Cardiac Enzymes: No results for input(s): "CKTOTAL", "CKMB", "CKMBINDEX", "TROPONINI" in the last 168 hours. BNP: Invalid input(s): "POCBNP" CBG: Recent Labs  Lab 06/30/22 1453 06/30/22 1513 06/30/22 1617 06/30/22 1722 06/30/22 1832  GLUCAP 129* 120* 162* 159* 121*   D-Dimer No results for input(s): "DDIMER" in the last 72 hours. Hgb A1c No results for input(s): "HGBA1C" in the last 72 hours. Lipid Profile No results  for input(s): "CHOL", "HDL", "LDLCALC", "TRIG", "CHOLHDL", "LDLDIRECT" in the last 72 hours. Thyroid function studies No results for input(s): "TSH", "T4TOTAL", "T3FREE", "THYROIDAB" in the last 72 hours.  Invalid input(s): "FREET3" Anemia work up No results for input(s): "VITAMINB12", "FOLATE", "FERRITIN", "TIBC", "IRON", "RETICCTPCT" in the last 72 hours. Urinalysis    Component Value Date/Time   COLORURINE STRAW (A) 06/30/2022 1102   APPEARANCEUR CLEAR 06/30/2022 1102   LABSPEC 1.027 06/30/2022 1102   PHURINE 5.0 06/30/2022 1102    GLUCOSEU >=500 (A) 06/30/2022 1102   HGBUR NEGATIVE 06/30/2022 1102   BILIRUBINUR NEGATIVE 06/30/2022 1102   KETONESUR 80 (A) 06/30/2022 1102   PROTEINUR NEGATIVE 06/30/2022 1102   UROBILINOGEN 0.2 12/31/2017 1310   NITRITE NEGATIVE 06/30/2022 1102   LEUKOCYTESUR NEGATIVE 06/30/2022 1102   Sepsis Labs Recent Labs  Lab 06/30/22 1100  WBC 9.4   Microbiology No results found for this or any previous visit (from the past 240 hour(s)).  Procedures/Studies: No results found.  Time coordinating discharge: Over 30 minutes  SIGNED:   Alessandra Bevels, MD  [facility] 06/30/2022, 6:57 PM

## 2022-06-30 NOTE — H&P (Signed)
History and Physical    DOA: 06/30/2022  PCP: Rana Snare, DO  Patient coming from: home  Chief Complaint: dizzy and BG high  HPI: Glen Merritt is a 22 y.o. male with history h/o type 1 diabetes mellitus who is on long-acting Tresiba 30 units at bedtime and Humalog per sliding scale at home at baseline presents today in DKA.  Patient denies any recent fevers or chills or dysuria or abdominal pain or nausea or vomiting.  He works for [employer] and has been running errands in the sun and feels might have been dehydrated but also states he has been keeping up with oral fluid intake.  He states last night he could not eat his dinner on time but did have a small meal late at night around 9 PM.  He reports taking [place] as scheduled last night and short-acting insulin this morning.  However when he got to work, he felt dyspneic, lightheaded and checked his blood pressure/pulse which showed his heart rate to be in the 140s.  He also checked his blood glucose which was apparently in the 300s.  He felt it would be best to seek medical care given lightheadedness and palpitations. ED course: BP 154/99, heart rate 115 (after IV fluids), respiratory rate 22, O2 sat 99 % on room air.  WBC 9.4, hemoglobin 14.0, hematocrit 42.9, platelet 335, sodium 127, potassium 4.3, chloride 94, BG 506, bicarb 9, BUN 10, creatinine 1.52, calcium 9.5.  Patient requested to be admitted for DKA.  Initially patient reluctant to stay and wanted to go home but advised of critical bicarb level/acidosis and he has agreed to stay for insulin drip overnight.   Review of Systems: As per HPI, otherwise review of systems negative.    Past Medical History:  Diagnosis Date   DKA (diabetic ketoacidosis) (HCC) 04/17/2022   History of diabetic ketoacidosis 04/26/2022   Hypertension    Ketonuria 09/29/2014   Type 1 diabetes mellitus (HCC) 10/11/2003   Dx 10/2003.  On a pump in the past    History reviewed. No pertinent surgical  history.  Social history:  reports that he has never smoked. He has never used smokeless tobacco. He reports that he does not currently use alcohol. He reports that he does not use drugs.   No Known Allergies  Family History  Problem Relation Age of Onset   Heart disease Maternal Grandmother    Heart disease Maternal Grandfather    Cancer Maternal Grandfather    Obesity Mother    Diabetes Mother    Hypertension Mother       Prior to Admission medications   Medication Sig Start Date End Date Taking? Authorizing Provider  B-D ULTRAFINE III SHORT PEN 31G X 8 MM MISC USE AS DIRECTED TWICE A DAY 09/28/18   Romero Belling, MD  Blood Glucose Monitoring Suppl (ACCU-CHEK GUIDE) w/Device KIT 1 Device by Does not apply route 4 (four) times daily. 07/11/18   Romero Belling, MD  Continuous Glucose Sensor (DEXCOM G7 SENSOR) MISC Use continuously to measure glucose levels. 05/01/22   Merrilyn Puma, MD  Glucagon 3 MG/DOSE POWD Place 3 mg into the nose once as needed for up to 1 dose. 02/08/22   Carlus Pavlov, MD  glucose blood (ACCU-CHEK GUIDE) test strip Used to check blood sugars four times daily. 05/17/17   Romero Belling, MD  insulin degludec (TRESIBA FLEXTOUCH) 200 UNIT/ML FlexTouch Pen Inject 30 Units into the skin daily. 04/26/22   Merrilyn Puma, MD  insulin lispro (  HUMALOG KWIKPEN) 100 UNIT/ML KwikPen Use as directed, inject a total of 40 units daily Patient taking differently: Inject 78 Units into the skin daily. 06/27/21   [name], MD  Lancets Misc. (ACCU-CHEK MULTICLIX LANCET DEV) KIT Used to check blood sugars four times daily. 05/17/17   Romero Belling, MD  sertraline (ZOLOFT) 25 MG tablet TAKE 1 TABLET (25 MG TOTAL) BY MOUTH DAILY. 05/23/22 05/23/23  Rana Snare, DO    Physical Exam: Vitals:   06/30/22 1049 06/30/22 1200 06/30/22 1337  BP: (!) 154/99    Pulse: (!) 115    Resp: (!) 22    Temp: 97.6 F (36.4 C)    TempSrc: Oral    SpO2: 99%    Weight:  72.6 kg 72.6 kg  Height:   5\' 9"  (1.753 m)     Constitutional: NAD, calm, comfortable Eyes: PERRL, lids and conjunctivae normal ENMT: Mucous membranes are moist. Posterior pharynx clear of any exudate or lesions.Normal dentition.  Neck: normal, supple, no masses, no thyromegaly Respiratory: clear to auscultation bilaterally, no wheezing, no crackles. Normal respiratory effort. No accessory muscle use.  Cardiovascular: Regular rate and rhythm, no murmurs / rubs / gallops. No extremity edema. 2+ pedal pulses. No carotid bruits.  Abdomen: no tenderness, no masses palpated. No hepatosplenomegaly. Bowel sounds positive.  Musculoskeletal: no clubbing / cyanosis. No joint deformity upper and lower extremities. Good ROM, no contractures. Normal muscle tone.  Neurologic: CN 2-12 grossly intact. Sensation intact, DTR normal. Strength 5/5 in all 4.  Psychiatric: Normal judgment and insight. Alert and oriented x 3. Normal mood.  SKIN/catheters: no rashes, lesions, ulcers. No induration  Labs on Admission: I have personally reviewed following labs and imaging studies  CBC: Recent Labs  Lab 06/30/22 1100  WBC 9.4  HGB 14.0  HCT 42.9  MCV 98.6  PLT 335   Basic Metabolic Panel: Recent Labs  Lab 06/30/22 1100  NA 127*  K 4.3  CL 94*  CO2 9*  GLUCOSE 506*  BUN 10  CREATININE 1.52*  CALCIUM 9.5   GFR: Estimated Creatinine Clearance: 74.9 mL/min (A) (by C-G formula based on SCr of 1.52 mg/dL (H)). Recent Labs  Lab 06/30/22 1100  WBC 9.4   Liver Function Tests: No results for input(s): "AST", "ALT", "ALKPHOS", "BILITOT", "PROT", "ALBUMIN" in the last 168 hours. No results for input(s): "LIPASE", "AMYLASE" in the last 168 hours. No results for input(s): "AMMONIA" in the last 168 hours. Coagulation Profile: No results for input(s): "INR", "PROTIME" in the last 168 hours. Cardiac Enzymes: No results for input(s): "CKTOTAL", "CKMB", "CKMBINDEX", "TROPONINI" in the last 168 hours. BNP (last 3 results) No results  for input(s): "PROBNP" in the last 8760 hours. HbA1C: No results for input(s): "HGBA1C" in the last 72 hours. CBG: Recent Labs  Lab 06/30/22 1049 06/30/22 1322  GLUCAP 511* 156*   Lipid Profile: No results for input(s): "CHOL", "HDL", "LDLCALC", "TRIG", "CHOLHDL", "LDLDIRECT" in the last 72 hours. Thyroid Function Tests: No results for input(s): "TSH", "T4TOTAL", "FREET4", "T3FREE", "THYROIDAB" in the last 72 hours. Anemia Panel: No results for input(s): "VITAMINB12", "FOLATE", "FERRITIN", "TIBC", "IRON", "RETICCTPCT" in the last 72 hours. Urine analysis:    Component Value Date/Time   COLORURINE STRAW (A) 06/30/2022 1102   APPEARANCEUR CLEAR 06/30/2022 1102   LABSPEC 1.027 06/30/2022 1102   PHURINE 5.0 06/30/2022 1102   GLUCOSEU >=500 (A) 06/30/2022 1102   HGBUR NEGATIVE 06/30/2022 1102   BILIRUBINUR NEGATIVE 06/30/2022 1102   KETONESUR 80 (A) 06/30/2022 1102  PROTEINUR NEGATIVE 06/30/2022 1102   UROBILINOGEN 0.2 12/31/2017 1310   NITRITE NEGATIVE 06/30/2022 1102   LEUKOCYTESUR NEGATIVE 06/30/2022 1102    Radiological Exams on Admission: Personally reviewed  No results found.  EKG: Not done in the ED today but previous EKG appears to have shown sinus tachycardia with normal QTc. Will order given tachycardia    Assessment and Plan:   Active Problems:   DKA (diabetic ketoacidosis) (HCC)    1.  Type 1 diabetes mellitus with diabetic ketoacidosis: Patient has had recurrent hospitalizations for the same and unclear precipitant at this time but could be related to skipping meals versus dehydration.  Overall appears to have poorly controlled diabetes at baseline-Last hemoglobin A1c in February 2024 was 10.6.  Patient advised of current critical bicarb level with anion gap and he agreed to stay overnight for IV insulin.  Will continue Endo tool that was initiated in the ED.  Will obtain serial labs.  Since patient not nauseous or vomiting, will initiate carb modified diet if  repeat BMP this afternoon shows improvement.    2.  Dehydration, mild AKI: Patient's creatinine appears to be elevated from baseline 1.0->1.5 now.  On IV fluids.  Monitor labs  3.  Tachycardia: Appears to be improving with IV fluids.  Received 2 L bolus in the ED and now on maintenance fluids.  EKG ordered  4.  Elevated blood pressure: Monitor for now, could be secondary to acute issues.  Can initiate ACE inhibitor if persistently elevated and once renal function improved.  5.  Depression/anxiety: Resume Zoloft  DVT prophylaxis: Lovenox   Code Status: Full code.Health care proxy would be his mother  Patient/Family Communication: Discussed with patient and all questions answered to satisfaction.  Consults called: None Admission status :Patient will be admitted under OBSERVATION status.The patient's presenting symptoms, physical exam findings, and initial radiographic and laboratory data in the context of their medical condition is felt to place them at low risk for further clinical deterioration. Furthermore, it is anticipated that the patient will be medically stable for discharge from the hospital within 2 midnights of hospital stay.       Alessandra Bevels MD [facility] Pager in [place]  If 7PM-7AM, please contact night-coverage [email or web address]   06/30/2022, 2:28 PM

## 2022-06-30 NOTE — ED Triage Notes (Signed)
Pt c/o hyperglycemia, intermittent generalized abdominal pain, and nausea since yesterday.  Currently denies pain.  Hx of DM.  Pt sts "it feels like the beginning of DKA."

## 2022-06-30 NOTE — ED Provider Notes (Signed)
[place] EMERGENCY DEPARTMENT AT [facility] Provider Note   CSN: [id number] Arrival date & time: 06/30/22  1044     History  Chief Complaint  Patient presents with   Hyperglycemia   Abdominal Pain    Glen Merritt is a 22 y.o. male.  The history is provided by the patient and medical records. No language interpreter was used.  Hyperglycemia Associated symptoms: abdominal pain   Abdominal Pain    Glen Merritt is a 22 year old male who presents to the ED with chief complaint of hyperglycemia x 1 day. The patient states he checked his BG this morning and noticed it was much higher than normal, he also has associated abdominal pain and tachypnea. Abdominal pain currently rated 7/10, denies nausea, vomiting, and diarrhea. He has had DKA a multitude of times before and says these symptoms feel nearly identical to his previous episodes. He believes this may have happened because he did not eat anything yesterday until late last night. He typically checks his BG twice daily once when he wakes up and again at 2pm.   Home Medications Prior to Admission medications   Medication Sig Start Date End Date Taking? Authorizing Provider  B-D ULTRAFINE III SHORT PEN 31G X 8 MM MISC USE AS DIRECTED TWICE A DAY 09/28/18   Romero Belling, MD  Blood Glucose Monitoring Suppl (ACCU-CHEK GUIDE) w/Device KIT 1 Device by Does not apply route 4 (four) times daily. 07/11/18   Romero Belling, MD  Continuous Glucose Sensor (DEXCOM G7 SENSOR) MISC Use continuously to measure glucose levels. 05/01/22   Merrilyn Puma, MD  Glucagon 3 MG/DOSE POWD Place 3 mg into the nose once as needed for up to 1 dose. 02/08/22   Carlus Pavlov, MD  glucose blood (ACCU-CHEK GUIDE) test strip Used to check blood sugars four times daily. 05/17/17   Romero Belling, MD  insulin degludec (TRESIBA FLEXTOUCH) 200 UNIT/ML FlexTouch Pen Inject 30 Units into the skin daily. 04/26/22   Merrilyn Puma, MD  insulin lispro (HUMALOG  KWIKPEN) 100 UNIT/ML KwikPen Use as directed, inject a total of 40 units daily Patient taking differently: Inject 78 Units into the skin daily. 06/27/21   [name], MD  Lancets Misc. (ACCU-CHEK MULTICLIX LANCET DEV) KIT Used to check blood sugars four times daily. 05/17/17   Romero Belling, MD  sertraline (ZOLOFT) 25 MG tablet TAKE 1 TABLET (25 MG TOTAL) BY MOUTH DAILY. 05/23/22 05/23/23  Rana Snare, DO      Allergies    Patient has no known allergies.    Review of Systems   Review of Systems  Gastrointestinal:  Positive for abdominal pain.  All other systems reviewed and are negative.   Physical Exam Updated Vital Signs BP (!) 154/99 (BP Location: Right Arm)   Pulse (!) 115   Temp 97.6 F (36.4 C) (Oral)   Resp (!) 22   SpO2 99%  Physical Exam Vitals and nursing note reviewed.  Constitutional:      General: He is not in acute distress.    Appearance: He is well-developed.  HENT:     Head: Atraumatic.     Mouth/Throat:     Comments: Mouth is dry Eyes:     Conjunctiva/sclera: Conjunctivae normal.  Cardiovascular:     Rate and Rhythm: Tachycardia present.     Heart sounds: Normal heart sounds.  Pulmonary:     Effort: Pulmonary effort is normal.     Breath sounds: Normal breath sounds. No wheezing, rhonchi  or rales.  Abdominal:     General: Abdomen is flat. Bowel sounds are normal.     Palpations: Abdomen is soft.     Tenderness: There is no abdominal tenderness.  Musculoskeletal:     Cervical back: Neck supple.  Skin:    General: Skin is dry.     Findings: No rash.  Neurological:     Mental Status: He is alert and oriented to person, place, and time.  Psychiatric:        Mood and Affect: Mood normal.     ED Results / Procedures / Treatments   Labs (all labs ordered are listed, but only abnormal results are displayed) Labs Reviewed  BASIC METABOLIC PANEL - Abnormal; Notable for the following components:      Result Value   Sodium 127 (*)    Chloride 94  (*)    CO2 9 (*)    Glucose, Bld 506 (*)    Creatinine, Ser 1.52 (*)    Anion gap 24 (*)    All other components within normal limits  URINALYSIS, ROUTINE W REFLEX MICROSCOPIC - Abnormal; Notable for the following components:   Color, Urine STRAW (*)    Glucose, UA >=500 (*)    Ketones, ur 80 (*)    All other components within normal limits  CBG MONITORING, ED - Abnormal; Notable for the following components:   Glucose-Capillary 511 (*)    All other components within normal limits  CBG MONITORING, ED - Abnormal; Notable for the following components:   Glucose-Capillary 156 (*)    All other components within normal limits  CBC  BETA-HYDROXYBUTYRIC ACID  BETA-HYDROXYBUTYRIC ACID  I-STAT VENOUS BLOOD GAS, ED    EKG None  Radiology No results found.  Procedures .Critical Care  Performed by: Fayrene Helper, PA-C Authorized by: Fayrene Helper, PA-C   Critical care provider statement:    Critical care time (minutes):  30   Critical care was time spent personally by me on the following activities:  Development of treatment plan with patient or surrogate, discussions with consultants, evaluation of patient's response to treatment, examination of patient, ordering and review of laboratory studies, ordering and review of radiographic studies, ordering and performing treatments and interventions, pulse oximetry, re-evaluation of patient's condition and review of old charts     Medications Ordered in ED Medications  lactated ringers bolus 1,452 mL (has no administration in time range)  insulin regular, human (MYXREDLIN) 100 units/ 100 mL infusion (has no administration in time range)  lactated ringers infusion (has no administration in time range)  dextrose 5 % in lactated ringers infusion (has no administration in time range)  dextrose 50 % solution 0-50 mL (has no administration in time range)  potassium chloride 10 mEq in 100 mL IVPB (has no administration in time range)    ED  Course/ Medical Decision Making/ A&P                             Medical Decision Making Amount and/or Complexity of Data Reviewed Labs: ordered.  Risk Prescription drug management.   BP (!) 154/99 (BP Location: Right Arm)   Pulse (!) 115   Temp 97.6 F (36.4 C) (Oral)   Resp (!) 22   SpO2 99%   12:34 PM Mr. Rieser is a 22 year old male who presents to the ED with chief complaint of hyperglycemia x 1 day. The patient states he checked his BG  this morning and noticed it was much higher than normal, he also has associated abdominal pain and tachypnea. Abdominal pain currently rated 7/10, denies nausea, vomiting, and diarrhea. He has had DKA a multitude of times before and says these symptoms feel nearly identical to his previous episodes. He believes this may have happened because he did not eat anything yesterday until late last night. He typically checks his BG twice daily once when he wakes up and again at 2pm.   On exam this is a well-appearing male resting company in bed appears to be in no acute discomfort.  Mouth is a bit dry, dry skin turgor, heart with tachycardia, lungs clear to auscultation bilaterally abdomen is soft nontender no reproducible tenderness to his abdominal exam.  He is mentating appropriately.  -Labs ordered, independently viewed and interpreted by me.  Labs remarkable for CBG 506, anion gap of 24, and UA showing 80 ketones consistent with DKA.  Will initiate protocol for treatment.  Na+ 127 likely pseudohyponatremia.   -The patient was maintained on a cardiac monitor.  I personally viewed and interpreted the cardiac monitored which showed an underlying rhythm of: sinus tachycardia -Imaging including abd/pelvis CT considered but not performed as abd pain likely 2/2 DKA -This patient presents to the ED for concern of weakness, this involves an extensive number of treatment options, and is a complaint that carries with it a high risk of complications and morbidity.   The differential diagnosis includes DKA, HHS, dehydration, electrolytes derangement, hypoglycemia, UTI -Co morbidities that complicate the patient evaluation includes DM -Treatment includes IVF, insulin -Reevaluation of the patient after these medicines showed that the patient improved -PCP office notes or outside notes reviewed -Discussion with specialist [facility] who agrees to see and will admit pt -Escalation to admission/observation considered: patient is agreeable with admission.          Final Clinical Impression(s) / ED Diagnoses Final diagnoses:  Diabetic ketoacidosis without coma associated with type 1 diabetes mellitus [facility])    Rx / DC Orders ED Discharge Orders     None         Fayrene Helper, PA-C 06/30/22 1350    Lonell Grandchild, MD 06/30/22 1453

## 2022-06-30 NOTE — Plan of Care (Signed)

## 2022-07-02 DIAGNOSIS — Z419 Encounter for procedure for purposes other than remedying health state, unspecified: Secondary | ICD-10-CM | POA: Diagnosis not present

## 2022-07-06 ENCOUNTER — Encounter: Payer: Self-pay | Admitting: Internal Medicine

## 2022-07-06 ENCOUNTER — Ambulatory Visit (INDEPENDENT_AMBULATORY_CARE_PROVIDER_SITE_OTHER): Payer: Medicaid Other | Admitting: Internal Medicine

## 2022-07-06 ENCOUNTER — Other Ambulatory Visit: Payer: Self-pay | Admitting: Internal Medicine

## 2022-07-06 VITALS — BP 124/80 | HR 106 | Ht 69.0 in | Wt 155.6 lb

## 2022-07-06 DIAGNOSIS — E10649 Type 1 diabetes mellitus with hypoglycemia without coma: Secondary | ICD-10-CM | POA: Diagnosis not present

## 2022-07-06 DIAGNOSIS — Z8639 Personal history of other endocrine, nutritional and metabolic disease: Secondary | ICD-10-CM

## 2022-07-06 DIAGNOSIS — E109 Type 1 diabetes mellitus without complications: Secondary | ICD-10-CM

## 2022-07-06 DIAGNOSIS — Z794 Long term (current) use of insulin: Secondary | ICD-10-CM | POA: Diagnosis not present

## 2022-07-06 DIAGNOSIS — E1010 Type 1 diabetes mellitus with ketoacidosis without coma: Secondary | ICD-10-CM | POA: Diagnosis not present

## 2022-07-06 LAB — MICROALBUMIN / CREATININE URINE RATIO
Creatinine,U: 62.9 mg/dL
Microalb Creat Ratio: 1.1 mg/g (ref 0.0–30.0)
Microalb, Ur: <0.7 mg/dL (ref 0.0–1.9)

## 2022-07-06 LAB — HEMOGLOBIN A1C: Hemoglobin A1C: 11.5

## 2022-07-06 MED ORDER — INSULIN PEN NEEDLE 32G X 4 MM MISC: 3 refills | Status: AC

## 2022-07-06 MED ORDER — DEXCOM G7 SENSOR MISC
3 refills | Status: AC
Start: 2022-07-06 — End: ?

## 2022-07-06 NOTE — Progress Notes (Addendum)
Patient ID: Glen Merritt, male   DOB: 08-Apr-2000, 22 y.o.   MRN: [id number]  HPI: Glen Merritt is a 22 y.o.-year-old male, returning for follow-up for DM1, diagnosed in 2005, uncontrolled, without long term complications, but with history of DKA and severe hypoglycemia.  He previously saw Dr. Everardo All, but last visit with me 5 months ago.  Since last visit with Dr. Everardo All and before our last visit, he was in admitted to [facility]:  hyperglycemia and DKA.  HbA1c was quite high at that time.  Per review of the ED physician note, patient mentioned that he was not taking his long-acting insulin due to previous hypoglycemia!  At today's visit, he mentions that this was actually a misunderstanding, and he was taking the long-acting insulin at that time...   Interim history: No increased urination, blurry vision, nausea, chest pain. He was again in ED for hyperglycemia 02/18/2022 and then admitted in DKA 04/17/2022 and 06/30/2022. He mention he did not skip insulin around the time of these episodes. He did not have a job until recently (beginning of 06/2022) >> now works at [employer] - works outside. He was dehydrated at last admission.  He mentions he thought he was drinking enough, but then he started to get sick and went to the hospital.  Reviewed HbA1c levels: Lab Results  Component Value Date   HGBA1C 10.6 (A) 02/08/2022   HGBA1C 10.7 (A) 01/23/2021   HGBA1C 10.3 (A) 11/02/2020  11/05/2021: HbA1c 10.1%  He is not on an insulin pump.  He had this in the past, but he was running track in high school >> inconvenient.  At last visit, he was on: - Tresiba 90 >> 70 units daily - Humalog -up to 40-50 units a day; ICR 1:4 Correction: 5-10 units (no SSI)  I recommended the following regimen: - Tresiba 70 >> 30 units daily - Humalog  ICR 1:4 Target 130 ISF:  20; max 8-10 correction  Meter: Accu-Chek guide  He was checking his blood sugars with the Dexcom CGM, but this came off and he did not refill  it.  Now off the sensor. In last week: - am: 170-190, 201 >> 160-170 - 2h after b'fast: n/c - before lunch: n/c >> 120 - 2h after lunch: n/c - before dinner: n/c >> 150 - 2h after dinner: n/c - bedtime: 140-150 >> 200 - nighttime:n/c Lowest sugar was 52 at night (took insulin and not eating all the food) >> 80; he has hypoglycemia awareness at 60.  He does not have a glucagon kit at home.  + Last severe hypoglycemia episode in 2021 (syncope at work - very active, did not eat).  Highest sugar was 300 >> 400s. + Hypoglycemia admission 02/2022.  + DKA admission in 11/2021, 04/2022, 06/2022.   Pt's meals are: -light meals  - no CKD, last BUN/creatinine:  Lab Results  Component Value Date   BUN 8 06/30/2022   BUN 10 06/30/2022   CREATININE 1.20 06/30/2022   CREATININE 1.52 (H) 06/30/2022  On lisinopril 5 mg daily.  -No HL; last set of lipids: Lab Results  Component Value Date   CHOL 143 04/02/2016   HDL 79 04/02/2016   LDLCALC 39 04/02/2016   TRIG 124 (H) 04/02/2016   CHOLHDL 1.8 04/02/2016   - last eye exam was in 03/2020. No DR reportedly.  - no numbness and tingling in his feet.  Last foot exam 04/26/2022.  He has nerve pain and muscle cramps.  Last TSH: Lab  Results  Component Value Date   TSH 1.61 11/14/2018   He also has a history of HTN.  ROS: + see HPI  Past Medical History:  Diagnosis Date   DKA (diabetic ketoacidosis) (HCC) 04/17/2022   History of diabetic ketoacidosis 04/26/2022   Hypertension    Ketonuria 09/29/2014   Type 1 diabetes mellitus (HCC) 10/11/2003   Dx 10/2003.  On a pump in the past   No past surgical history on file. Social History   Socioeconomic History   Marital status: Single    Spouse name: Not on file   Number of children: Not on file   Years of education: Not on file   Highest education level: Not on file  Occupational History   Not on file  Tobacco Use   Smoking status: Never   Smokeless tobacco: Never  Vaping Use    Vaping Use: Every day  Substance and Sexual Activity   Alcohol use: Not Currently   Drug use: Never   Sexual activity: Not on file  Other Topics Concern   Not on file  Social History Narrative   Not on file   Social Determinants of Health   Financial Resource Strain: Not on file  Food Insecurity: No Food Insecurity (06/30/2022)   Hunger Vital Sign    Worried About Running Out of Food in the Last Year: Never true    Ran Out of Food in the Last Year: Never true  Recent Concern: Food Insecurity - Food Insecurity Present (04/17/2022)   Hunger Vital Sign    Worried About Running Out of Food in the Last Year: Sometimes true    Ran Out of Food in the Last Year: Sometimes true  Transportation Needs: No Transportation Needs (06/30/2022)   PRAPARE - Administrator, Civil Service (Medical): No    Lack of Transportation (Non-Medical): No  Physical Activity: Not on file  Stress: Not on file  Social Connections: Not on file  Intimate Partner Violence: Not At Risk (06/30/2022)   Humiliation, Afraid, Rape, and Kick questionnaire    Fear of Current or Ex-Partner: No    Emotionally Abused: No    Physically Abused: No    Sexually Abused: No   Current Outpatient Medications on File Prior to Visit  Medication Sig Dispense Refill   B-D ULTRAFINE III SHORT PEN 31G X 8 MM MISC USE AS DIRECTED TWICE A DAY 180 each 3   Blood Glucose Monitoring Suppl (ACCU-CHEK GUIDE) w/Device KIT 1 Device by Does not apply route 4 (four) times daily. 1 kit 0   Continuous Glucose Sensor (DEXCOM G7 SENSOR) MISC Use continuously to measure glucose levels. 1 each 1   Glucagon 3 MG/DOSE POWD Place 3 mg into the nose once as needed for up to 1 dose. 1 each 11   glucose blood (ACCU-CHEK GUIDE) test strip Used to check blood sugars four times daily. 200 each 11   insulin degludec (TRESIBA FLEXTOUCH) 200 UNIT/ML FlexTouch Pen Inject 30 Units into the skin daily. 48 mL 3   insulin lispro (HUMALOG KWIKPEN) 100 UNIT/ML  KwikPen USE AS DIRECTED, INJECT A TOTAL OF 40 UNITS DAILY 45 mL 0   Lancets Misc. (ACCU-CHEK MULTICLIX LANCET DEV) KIT Used to check blood sugars four times daily. 200 each 11   sertraline (ZOLOFT) 25 MG tablet TAKE 1 TABLET (25 MG TOTAL) BY MOUTH DAILY. (Patient not taking: Reported on 06/30/2022) 90 tablet 1   No current facility-administered medications on file prior to visit.  Not on File Family History  Problem Relation Age of Onset   Heart disease Maternal Grandmother    Heart disease Maternal Grandfather    Cancer Maternal Grandfather    Obesity Mother    Diabetes Mother    Hypertension Mother    PE: BP 124/80   Pulse (!) 106   Ht 5\' 9"  (1.753 m)   Wt 155 lb 9.6 oz (70.6 kg)   SpO2 99%   BMI 22.98 kg/m  Wt Readings from Last 3 Encounters:  07/06/22 155 lb 9.6 oz (70.6 kg)  06/30/22 160 lb (72.6 kg)  04/26/22 154 lb 9.6 oz (70.1 kg)   Constitutional: normal weight, in NAD Eyes:  EOMI, no exophthalmos ENT: no neck masses, no cervical lymphadenopathy Cardiovascular: Tachycardia, RR, No MRG Respiratory: CTA B Musculoskeletal: no deformities Skin:no rashes Neurological: no tremor with outstretched hands  ASSESSMENT: 1. DM1, uncontrolled, without long-term complications, but with hyperglycemia, DKA, and hypoglycemia  2. H/o hypoglycemia unawareness  PLAN:  1. Patient longstanding, uncontrolled, type 1 diabetes, with multiple DKA episodes.  Before last visit he had a DKA admission in 11/2021 for an unknown reason, but per ED physician due to the fact that he was missing long-acting insulin due to hypoglycemia (patient mentioned that this was not correct).  At that time, HbA1c was 10.1%.  At our last visit, this was even higher, at 10.6%.  Since then, he had 3 other hospital visits, 1 for hyperglycemia and 2 admissions for DKA. -At our last visit, sugars were not as elevated as expected from the HbA1c.  They were usually above target in the morning but more controlled at  night.  Unfortunately, he was not checking sugars between these times of the day as he was very busy at work.  He wanted me to fill out his DMV form but I advised him that I need to see him again and see if he is compliant with the recommended regimen before I can fill this out for him.  He was on a CGM in the past and I strongly advised him to restart it and sent prescriptions for the Dexcom G7 CGM to his pharmacy.  I advised him to link it to the clinic and to let me know in 2 weeks.  However, he did not do so. -At last visit, I advised him to schedule an appointment with diabetes educator to learn concepts of bolus calculation, carb counting, and hopefully to start on insulin pump, since he was not involved in sports anymore, which was the main reason why he came off the pump in the past (he had a Cosmo pump).  We reviewed possible diabetes complications.  Upon reviewing the insulin to carb ratio, he mentions that this was approximately 1:15, however, after reviewing how he calculated the bolus, it appears that this was actually 1: 4, which was very strict.  I suspected that this was why he was having low blood sugars and we discussed about changing this and also gave him a new sensitivity factor. -Since last visit, he did not have a job and was not able to look into insulin pumps or have the appointment with the diabetes educator.  I started a very active job at the beginning of last month. -At today's visit, he returns after having had a series of admissions for DKA and hyperglycemia.  He mentions that the last episode of DKA likely happened due to dehydration.  We discussed about the importance of drinking enough water especially as  he is working outside in the heat. -He came off the sensor and I sent a new prescription for his pharmacy.  This will greatly help.  -At last visit, he was on a high dose of Tresiba, 70 units daily.  This was reduced since last visit only 30 units.  Since sugars in the morning  are still above target, I advised him to increase this dose slightly.  For now, I do not feel he needs a change in his Humalog doses as sugars throughout the day appear to be not far from target, but we reviewed how to calculate the doses based on ICR. -I also advised him to change his pen needles, from 8 mm to 4 mm.  Prescription sent. -He would be very interested in an insulin pump.  I gave him a list of times that I suggest and he will look into coverage. - I suggested to: Patient Instructions  Please look into: - Omnipod 5 - Tandem t:slim x2 - Tandem mobi These integrate with the Dexcom CGM.  Please schedule an appt with Cristy Folks for diabetes education. [phone].  Restart the CGM ASAP.  Please increase: - Tresiba 34 units daily  Continue: - Humalog  ICR 1:4 Target 130 ISF:  20; max 8-10 correction  Please return in 3 months.  - we checked his HbA1c: 11.5% (higher) - advised to check sugars at different times of the day - 4x a day, rotating check times - advised for yearly eye exams >> he is UTD - at today's visit we will check his lipids, ACR, and TSH - return to clinic in 3 months  2.  H/o Hypoglycemia unawareness -We need to get him back on the CGM to alert him whenever sugars start to drop -Discussed that hypoglycemia unawareness is corrected by avoiding hypoglycemia completely for period of at least 3 weeks. - as of now, he tells me he started to feel sugars in the 60s   Component     Latest Ref Rng 07/06/2022  Microalb, Ur     0.0 - 1.9 mg/dL <0.9   Creatinine,U     mg/dL 81.1   MICROALB/CREAT RATIO     0.0 - 30.0 mg/g 1.1   ACR normal.  He did not have the lipid panel and TSH drawn today.  Will check this at next visit.  Carlus Pavlov, MD PhD [facility] Endocrinology

## 2022-07-06 NOTE — Patient Instructions (Signed)
Please look into: - Omnipod 5 - Tandem t:slim x2 - Tandem mobi These integrate with the Dexcom CGM.  Please schedule an appt with Cristy Folks for diabetes education. [phone].  Restart the CGM ASAP.  Please increase: - Tresiba 34 units daily  Continue: - Humalog  ICR 1:4 Target 130 ISF:  20; max 8-10 correction  Please return in 3 months.

## 2022-07-30 DIAGNOSIS — Z113 Encounter for screening for infections with a predominantly sexual mode of transmission: Secondary | ICD-10-CM | POA: Diagnosis not present

## 2022-08-02 DIAGNOSIS — Z419 Encounter for procedure for purposes other than remedying health state, unspecified: Secondary | ICD-10-CM | POA: Diagnosis not present

## 2022-09-02 DIAGNOSIS — Z419 Encounter for procedure for purposes other than remedying health state, unspecified: Secondary | ICD-10-CM | POA: Diagnosis not present

## 2022-10-01 ENCOUNTER — Other Ambulatory Visit: Payer: Self-pay | Admitting: Internal Medicine

## 2022-10-01 DIAGNOSIS — E109 Type 1 diabetes mellitus without complications: Secondary | ICD-10-CM

## 2022-10-02 DIAGNOSIS — Z419 Encounter for procedure for purposes other than remedying health state, unspecified: Secondary | ICD-10-CM | POA: Diagnosis not present

## 2022-10-10 ENCOUNTER — Ambulatory Visit: Payer: Medicaid Other | Admitting: Internal Medicine

## 2022-10-10 NOTE — Progress Notes (Deleted)
Patient ID: Glen Merritt, male   DOB: [birth year], 22 y.o.   MRN: [id number]  HPI: Glen Merritt is a 22 y.o.-year-old male, returning for follow-up for DM1, diagnosed in 2005, uncontrolled, without long term complications, but with history of DKA and severe hypoglycemia.  He previously saw Dr. Everardo Merritt, but last visit with me 3 months ago.  Interim history: No increased urination, blurry vision, nausea, chest pain. He works at [employer] - works outside.   Reviewed HbA1c levels: 07/06/2022: HbA1c 11.5% Lab Results  Component Value Date   HGBA1C 10.6 (A) 02/08/2022   HGBA1C 10.7 (A) 01/23/2021   HGBA1C 10.3 (A) 11/02/2020  11/05/2021: HbA1c 10.1%  He is not on an insulin pump.  He had this in the past, but he was running track in high school >> inconvenient.  At last visit, he was on: - Tresiba 90 >> 70 units daily - Humalog -up to 40-50 units a day; ICR 1:4 Correction: 5-10 units (no SSI)  I recommended the following regimen: - Tresiba 70 >> 30 >> 34 units daily - Humalog  ICR 1:4 Target 130 ISF:  20; max 8-10 correction  Meter: Accu-Chek guide  He was checking his blood sugars with the Dexcom CGM, but this came off before last visit.  Now back on it:  Prev.: - am: 170-190, 201 >> 160-170 - 2h after b'fast: n/c - before lunch: n/c >> 120 - 2h after lunch: n/c - before dinner: n/c >> 150 - 2h after dinner: n/c - bedtime: 140-150 >> 200 - nighttime:n/c Lowest sugar was 52 at night (took insulin and not eating Merritt the food) >> 80; he has hypoglycemia awareness at 60.  He does not have a glucagon kit at home.  + Last severe hypoglycemia episode in 2021 (syncope at work - very active, did not eat).  Highest sugar was 300 >> 400s.  + Hypoglycemia admission 02/2022.   + DKA admission in 11/2021, in ED for hyperglycemia 02/18/2022, admitted in DKA 04/17/2022 and 06/30/2022. He mentioned he did not skip insulin around the time of these episodes.  He did mention that he was dehydrated  during the last admission, after working outside in the heat.  - no CKD, last BUN/creatinine:  Lab Results  Component Value Date   BUN 8 06/30/2022   BUN 10 06/30/2022   CREATININE 1.20 06/30/2022   CREATININE 1.52 (H) 06/30/2022   Lab Results  Component Value Date   MICRALBCREAT 1.1 07/06/2022   MICRALBCREAT 7 04/02/2016   MICRALBCREAT 12 11/23/2014  On lisinopril 5 mg daily.  -No HL; last set of lipids: Lab Results  Component Value Date   CHOL 143 04/02/2016   HDL 79 04/02/2016   LDLCALC 39 04/02/2016   TRIG 124 (H) 04/02/2016   CHOLHDL 1.8 04/02/2016   - last eye exam was in 03/2020. No DR reportedly.  - no numbness and tingling in his feet.  Last foot exam 04/26/2022.  He has nerve pain and muscle cramps.  Last TSH: Lab Results  Component Value Date   TSH 1.61 11/14/2018   He also has a history of HTN.  ROS: + see HPI  Past Medical History:  Diagnosis Date   DKA (diabetic ketoacidosis) (HCC) 04/17/2022   History of diabetic ketoacidosis 04/26/2022   Hypertension    Ketonuria 09/29/2014   Type 1 diabetes mellitus (HCC) 10/11/2003   Dx 10/2003.  On a pump in the past   No past surgical history on file. Social History  Socioeconomic History   Marital status: Single    Spouse name: Not on file   Number of children: Not on file   Years of education: Not on file   Highest education level: Not on file  Occupational History   Not on file  Tobacco Use   Smoking status: Never   Smokeless tobacco: Never  Vaping Use   Vaping status: Every Day  Substance and Sexual Activity   Alcohol use: Not Currently   Drug use: Never   Sexual activity: Not on file  Other Topics Concern   Not on file  Social History Narrative   Not on file   Social Determinants of Health   Financial Resource Strain: Not on file  Food Insecurity: No Food Insecurity (06/30/2022)   Hunger Vital Sign    Worried About Running Out of Food in the Last Year: Never true    Ran Out of  Food in the Last Year: Never true  Recent Concern: Food Insecurity - Food Insecurity Present (04/17/2022)   Hunger Vital Sign    Worried About Running Out of Food in the Last Year: Sometimes true    Ran Out of Food in the Last Year: Sometimes true  Transportation Needs: No Transportation Needs (06/30/2022)   PRAPARE - Administrator, Civil Service (Medical): No    Lack of Transportation (Non-Medical): No  Physical Activity: Not on file  Stress: Not on file  Social Connections: Not on file  Intimate Partner Violence: Not At Risk (06/30/2022)   Humiliation, Afraid, Rape, and Kick questionnaire    Fear of Current or Ex-Partner: No    Emotionally Abused: No    Physically Abused: No    Sexually Abused: No   Current Outpatient Medications on File Prior to Visit  Medication Sig Dispense Refill   Blood Glucose Monitoring Suppl (ACCU-CHEK GUIDE) w/Device KIT 1 Device by Does not apply route 4 (four) times daily. 1 kit 0   Continuous Glucose Sensor (DEXCOM G7 SENSOR) MISC Use continuously to measure glucose levels. 6 each 3   Glucagon 3 MG/DOSE POWD Place 3 mg into the nose once as needed for up to 1 dose. 1 each 11   glucose blood (ACCU-CHEK GUIDE) test strip Used to check blood sugars four times daily. 200 each 11   insulin degludec (TRESIBA FLEXTOUCH) 200 UNIT/ML FlexTouch Pen Inject 30 Units into the skin daily. 48 mL 3   insulin lispro (HUMALOG KWIKPEN) 100 UNIT/ML KwikPen USE AS DIRECTED, INJECT A TOTAL OF 40 UNITS DAILY 45 mL 1   Insulin Pen Needle 32G X 4 MM MISC Use 4x a day 400 each 3   Lancets Misc. (ACCU-CHEK MULTICLIX LANCET DEV) KIT Used to check blood sugars four times daily. 200 each 11   sertraline (ZOLOFT) 25 MG tablet TAKE 1 TABLET (25 MG TOTAL) BY MOUTH DAILY. (Patient not taking: Reported on 07/06/2022) 90 tablet 1   No current facility-administered medications on file prior to visit.   Not on File Family History  Problem Relation Age of Onset   Heart disease  Maternal Grandmother    Heart disease Maternal Grandfather    Cancer Maternal Grandfather    Obesity Mother    Diabetes Mother    Hypertension Mother    PE: There were no vitals taken for this visit. Wt Readings from Last 3 Encounters:  07/06/22 155 lb 9.6 oz (70.6 kg)  06/30/22 160 lb (72.6 kg)  04/26/22 154 lb 9.6 oz (70.1 kg)  Constitutional: normal weight, in NAD Eyes:  EOMI, no exophthalmos ENT: no neck masses, no cervical lymphadenopathy Cardiovascular: RRR, No MRG Respiratory: CTA B Musculoskeletal: no deformities Skin:no rashes Neurological: no tremor with outstretched hands  ASSESSMENT: 1. DM1, uncontrolled, without long-term complications, but with hyperglycemia, DKA, and hypoglycemia  2. H/o hypoglycemia unawareness  PLAN:  1. Patient longstanding, uncontrolled, type 1 diabetes, with multiple DKA episodes.  No ED visits or admissions since last visit. -At last visit, HbA1c returned even higher than before, at 11.5%.  At that time, he was off his sensor.  I sent the prescription to his pharmacy.  We also increased the dose of Tresiba.  I advised him to change his pen needles, from 8 mm to 4 mm.  He was very interested in an insulin pump and I gave him a list of pumps and advised him to check on coverage.  Of note, he had a Cosmo pump in the past.  I also referred him to diabetes education. CGM interpretation: -At today's visit, we reviewed his CGM downloads: It appears that *** of values are in target range (goal >70%), while *** are higher than 180 (goal <25%), and *** are lower than 70 (goal <4%).  The calculated average blood sugar is ***.  The projected HbA1c for the next 3 months (GMI) is ***. -Reviewing the CGM trends, ***  - I suggested to: Patient Instructions  Please look into: - Omnipod 5 - Tandem t:slim x2 - Tandem mobi These integrate with the Dexcom CGM.  Please schedule an appt with Cristy Folks for diabetes education. [phone].  Please use  the following regimen: - Tresiba 34 units daily - Humalog  ICR 1:4 Target 130 ISF:  20; max 8-10 correction  Please return in 3 months.  - we checked his HbA1c: 7%  - advised to check sugars at different times of the day - 4x a day, rotating check times - advised for yearly eye exams >> he is UTD - will check a Lipid panel and TSH now - return to clinic in 3 months  2.  H/o Hypoglycemia unawareness -We discussed that hypoglycemia unawareness can be corrected by completely avoiding hypoglycemia 4.  At the least 3 weeks -I strongly advised him to start back on the CGM at last visit -He started to feel sugars in the 60s before last visit  Carlus Pavlov, MD PhD [facility] Endocrinology

## 2022-11-02 DIAGNOSIS — Z419 Encounter for procedure for purposes other than remedying health state, unspecified: Secondary | ICD-10-CM | POA: Diagnosis not present

## 2022-11-06 ENCOUNTER — Other Ambulatory Visit (HOSPITAL_COMMUNITY): Payer: Self-pay

## 2022-11-06 ENCOUNTER — Telehealth: Payer: Self-pay

## 2022-11-06 NOTE — Telephone Encounter (Signed)
Pharmacy Patient Advocate Encounter   Received notification from CoverMyMeds that prior authorization for Dexcom G7 sensor is required/requested.   Insurance verification completed.   The patient is insured through [facility] .   Per test claim: PA required; PA submitted to above mentioned insurance via CoverMyMeds Key/confirmation #/EOC [id number] Status is pending

## 2022-11-08 NOTE — Telephone Encounter (Signed)
Pharmacy Patient Advocate Encounter  Received notification from [facility] that Prior Authorization for Dexcom G7 sensor has been APPROVED through 05/05/2023   PA #/Case ID/Reference #: [id number]

## 2023-01-23 ENCOUNTER — Encounter (HOSPITAL_BASED_OUTPATIENT_CLINIC_OR_DEPARTMENT_OTHER): Payer: Self-pay | Admitting: Emergency Medicine

## 2023-01-23 ENCOUNTER — Observation Stay (HOSPITAL_BASED_OUTPATIENT_CLINIC_OR_DEPARTMENT_OTHER)
Admission: EM | Admit: 2023-01-23 | Discharge: 2023-01-24 | Disposition: A | Discharge status: Home / Self Care | Payer: Medicaid Other | Attending: Internal Medicine | Admitting: Internal Medicine

## 2023-01-23 ENCOUNTER — Other Ambulatory Visit: Payer: Self-pay

## 2023-01-23 DIAGNOSIS — E1010 Type 1 diabetes mellitus with ketoacidosis without coma: Secondary | ICD-10-CM | POA: Diagnosis not present

## 2023-01-23 DIAGNOSIS — E1165 Type 2 diabetes mellitus with hyperglycemia: Secondary | ICD-10-CM | POA: Insufficient documentation

## 2023-01-23 DIAGNOSIS — Z794 Long term (current) use of insulin: Secondary | ICD-10-CM | POA: Insufficient documentation

## 2023-01-23 DIAGNOSIS — F32A Depression, unspecified: Secondary | ICD-10-CM | POA: Diagnosis not present

## 2023-01-23 DIAGNOSIS — E871 Hypo-osmolality and hyponatremia: Secondary | ICD-10-CM | POA: Diagnosis not present

## 2023-01-23 DIAGNOSIS — R0602 Shortness of breath: Secondary | ICD-10-CM | POA: Diagnosis present

## 2023-01-23 DIAGNOSIS — E1110 Type 2 diabetes mellitus with ketoacidosis without coma: Principal | ICD-10-CM | POA: Insufficient documentation

## 2023-01-23 DIAGNOSIS — I10 Essential (primary) hypertension: Secondary | ICD-10-CM | POA: Diagnosis not present

## 2023-01-23 DIAGNOSIS — R7989 Other specified abnormal findings of blood chemistry: Secondary | ICD-10-CM | POA: Diagnosis present

## 2023-01-23 LAB — BASIC METABOLIC PANEL
Anion gap: 29 — ABNORMAL HIGH (ref 5–15)
Anion gap: 21 — ABNORMAL HIGH (ref 5–15)
Anion gap: 15 (ref 5–15)
Anion gap: 14 (ref 5–15)
BUN: 19 mg/dL (ref 6–20)
BUN: 16 mg/dL (ref 6–20)
BUN: 13 mg/dL (ref 6–20)
BUN: 10 mg/dL (ref 6–20)
CO2: 8 mmol/L — ABNORMAL LOW (ref 22–32)
CO2: 9 mmol/L — ABNORMAL LOW (ref 22–32)
CO2: 12 mmol/L — ABNORMAL LOW (ref 22–32)
CO2: 17 mmol/L — ABNORMAL LOW (ref 22–32)
Calcium: 9.7 mg/dL (ref 8.9–10.3)
Calcium: 9.3 mg/dL (ref 8.9–10.3)
Calcium: 8.7 mg/dL — ABNORMAL LOW (ref 8.9–10.3)
Calcium: 9.2 mg/dL (ref 8.9–10.3)
Chloride: 92 mmol/L — ABNORMAL LOW (ref 98–111)
Chloride: 101 mmol/L (ref 98–111)
Chloride: 105 mmol/L (ref 98–111)
Chloride: 102 mmol/L (ref 98–111)
Creatinine, Ser: 1.15 mg/dL (ref 0.61–1.24)
Creatinine, Ser: 0.9 mg/dL (ref 0.61–1.24)
Creatinine, Ser: 0.85 mg/dL (ref 0.61–1.24)
Creatinine, Ser: 0.74 mg/dL (ref 0.61–1.24)
GFR, Estimated: >60 mL/min (ref >60)
GFR, Estimated: >60 mL/min (ref >60)
GFR, Estimated: >60 mL/min (ref >60)
GFR, Estimated: >60 mL/min (ref >60)
Glucose, Bld: 445 mg/dL — ABNORMAL HIGH (ref 70–99)
Glucose, Bld: 168 mg/dL — ABNORMAL HIGH (ref 70–99)
Glucose, Bld: 191 mg/dL — ABNORMAL HIGH (ref 70–99)
Glucose, Bld: 196 mg/dL — ABNORMAL HIGH (ref 70–99)
Potassium: 4.2 mmol/L (ref 3.5–5.1)
Potassium: 4.3 mmol/L (ref 3.5–5.1)
Potassium: 4.3 mmol/L (ref 3.5–5.1)
Potassium: 3.5 mmol/L (ref 3.5–5.1)
Sodium: 129 mmol/L — ABNORMAL LOW (ref 135–145)
Sodium: 131 mmol/L — ABNORMAL LOW (ref 135–145)
Sodium: 132 mmol/L — ABNORMAL LOW (ref 135–145)
Sodium: 133 mmol/L — ABNORMAL LOW (ref 135–145)

## 2023-01-23 LAB — GLUCOSE, CAPILLARY
Glucose-Capillary: 187 mg/dL — ABNORMAL HIGH (ref 70–99)
Glucose-Capillary: 199 mg/dL — ABNORMAL HIGH (ref 70–99)
Glucose-Capillary: 157 mg/dL — ABNORMAL HIGH (ref 70–99)
Glucose-Capillary: 123 mg/dL — ABNORMAL HIGH (ref 70–99)
Glucose-Capillary: 141 mg/dL — ABNORMAL HIGH (ref 70–99)

## 2023-01-23 LAB — URINALYSIS, ROUTINE W REFLEX MICROSCOPIC
Bacteria, UA: NONE SEEN
Bilirubin Urine: NEGATIVE
Glucose, UA: >1000 mg/dL — AB
Hgb urine dipstick: NEGATIVE
Ketones, ur: >80 mg/dL — AB
Leukocytes,Ua: NEGATIVE
Nitrite: NEGATIVE
Protein, ur: NEGATIVE mg/dL
Specific Gravity, Urine: 1.031 — ABNORMAL HIGH (ref 1.005–1.030)
pH: 5 (ref 5.0–8.0)

## 2023-01-23 LAB — I-STAT VENOUS BLOOD GAS, ED
Acid-base deficit: 18 mmol/L — ABNORMAL HIGH (ref 0.0–2.0)
Bicarbonate: 6.8 mmol/L — ABNORMAL LOW (ref 20.0–28.0)
Calcium, Ion: 1.1 mmol/L — ABNORMAL LOW (ref 1.15–1.40)
HCT: 42 % (ref 39.0–52.0)
Hemoglobin: 14.3 g/dL (ref 13.0–17.0)
O2 Saturation: 83 %
Potassium: 4.9 mmol/L (ref 3.5–5.1)
Sodium: 126 mmol/L — ABNORMAL LOW (ref 135–145)
TCO2: 7 mmol/L — ABNORMAL LOW (ref 22–32)
pCO2, Ven: 16.3 mm[Hg] — CL (ref 44–60)
pH, Ven: 7.23 — ABNORMAL LOW (ref 7.25–7.43)
pO2, Ven: 55 mm[Hg] — ABNORMAL HIGH (ref 32–45)

## 2023-01-23 LAB — BETA-HYDROXYBUTYRIC ACID
Beta-Hydroxybutyric Acid: >8 mmol/L — ABNORMAL HIGH (ref 0.05–0.27)
Beta-Hydroxybutyric Acid: 5.92 mmol/L — ABNORMAL HIGH (ref 0.05–0.27)

## 2023-01-23 LAB — CBC WITH DIFFERENTIAL/PLATELET
Abs Immature Granulocytes: 0.07 10*3/uL (ref 0.00–0.07)
Basophils Absolute: 0.1 10*3/uL (ref 0.0–0.1)
Basophils Relative: 1 %
Eosinophils Absolute: 0 10*3/uL (ref 0.0–0.5)
Eosinophils Relative: 0 %
HCT: 46.3 % (ref 39.0–52.0)
Hemoglobin: 15.4 g/dL (ref 13.0–17.0)
Immature Granulocytes: 1 %
Lymphocytes Relative: 31 %
Lymphs Abs: 3.8 10*3/uL (ref 0.7–4.0)
MCH: 31.6 pg (ref 26.0–34.0)
MCHC: 33.3 g/dL (ref 30.0–36.0)
MCV: 94.9 fL (ref 80.0–100.0)
Monocytes Absolute: 0.8 10*3/uL (ref 0.1–1.0)
Monocytes Relative: 6 %
Neutro Abs: 7.7 10*3/uL (ref 1.7–7.7)
Neutrophils Relative %: 61 %
Platelets: 385 10*3/uL (ref 150–400)
RBC: 4.88 MIL/uL (ref 4.22–5.81)
RDW: 12.5 % (ref 11.5–15.5)
WBC: 12.6 10*3/uL — ABNORMAL HIGH (ref 4.0–10.5)
nRBC: 0 % (ref 0.0–0.2)

## 2023-01-23 LAB — MRSA NEXT GEN BY PCR, NASAL: MRSA by PCR Next Gen: NOT DETECTED

## 2023-01-23 LAB — CBG MONITORING, ED
Glucose-Capillary: 456 mg/dL — ABNORMAL HIGH (ref 70–99)
Glucose-Capillary: 510 mg/dL (ref 70–99)
Glucose-Capillary: 312 mg/dL — ABNORMAL HIGH (ref 70–99)
Glucose-Capillary: 413 mg/dL — ABNORMAL HIGH (ref 70–99)
Glucose-Capillary: 224 mg/dL — ABNORMAL HIGH (ref 70–99)
Glucose-Capillary: 177 mg/dL — ABNORMAL HIGH (ref 70–99)

## 2023-01-23 LAB — HEMOGLOBIN A1C
Hgb A1c MFr Bld: 11.5 % — ABNORMAL HIGH (ref 4.8–5.6)
Mean Plasma Glucose: 283.35 mg/dL

## 2023-01-23 MED ORDER — ONDANSETRON HCL 4 MG/2ML IJ SOLN: 4.0000 mg | Freq: Four times a day (QID) | INTRAMUSCULAR | Status: DC | PRN

## 2023-01-23 MED ORDER — CHLORHEXIDINE GLUCONATE CLOTH 2 % EX PADS: 6.0000 | MEDICATED_PAD | Freq: Every day | CUTANEOUS | Status: DC

## 2023-01-23 MED ORDER — POTASSIUM CHLORIDE 10 MEQ/100ML IV SOLN
10.0000 meq | INTRAVENOUS | Status: AC
  Administered 2023-01-23 (×2): 10 meq via INTRAVENOUS
  Filled 2023-01-23 (×2): qty 100

## 2023-01-23 MED ORDER — LACTATED RINGERS IV BOLUS
20.0000 mL/kg | Freq: Once | INTRAVENOUS | Status: AC
  Administered 2023-01-23: 1244 mL via INTRAVENOUS

## 2023-01-23 MED ORDER — DILTIAZEM HCL 30 MG PO TABS: 30.0000 mg | ORAL_TABLET | Freq: Once | ORAL | Status: DC

## 2023-01-23 MED ORDER — DEXTROSE 50 % IV SOLN: 0.0000 mL | INTRAVENOUS | Status: DC | PRN

## 2023-01-23 MED ORDER — INSULIN GLARGINE-YFGN 100 UNIT/ML ~~LOC~~ SOLN
30.0000 [IU] | Freq: Every day | SUBCUTANEOUS | Status: DC
  Administered 2023-01-23: 30 [IU] via SUBCUTANEOUS
  Filled 2023-01-23 (×2): qty 0.3

## 2023-01-23 MED ORDER — DEXTROSE IN LACTATED RINGERS 5 % IV SOLN: INTRAVENOUS | Status: AC

## 2023-01-23 MED ORDER — DILTIAZEM HCL ER COATED BEADS 120 MG PO CP24: 120.0000 mg | ORAL_CAPSULE | Freq: Every day | ORAL | Status: DC

## 2023-01-23 MED ORDER — ACETAMINOPHEN 650 MG RE SUPP: 650.0000 mg | Freq: Four times a day (QID) | RECTAL | Status: DC | PRN

## 2023-01-23 MED ORDER — INSULIN ASPART 100 UNIT/ML IJ SOLN
0.0000 [IU] | Freq: Three times a day (TID) | INTRAMUSCULAR | Status: DC
  Administered 2023-01-24: 2 [IU] via SUBCUTANEOUS

## 2023-01-23 MED ORDER — INSULIN REGULAR(HUMAN) IN NACL 100-0.9 UT/100ML-% IV SOLN
INTRAVENOUS | Status: DC
  Administered 2023-01-23: 10 [IU]/h via INTRAVENOUS
  Filled 2023-01-23 (×2): qty 100

## 2023-01-23 MED ORDER — HYDRALAZINE HCL 20 MG/ML IJ SOLN
20.0000 mg | INTRAMUSCULAR | Status: DC | PRN
  Administered 2023-01-23: 20 mg via INTRAVENOUS
  Filled 2023-01-23: qty 1

## 2023-01-23 MED ORDER — ONDANSETRON HCL 4 MG PO TABS: 4.0000 mg | ORAL_TABLET | Freq: Four times a day (QID) | ORAL | Status: DC | PRN

## 2023-01-23 MED ORDER — LACTATED RINGERS IV BOLUS
1000.0000 mL | Freq: Once | INTRAVENOUS | Status: AC
  Administered 2023-01-23: 1000 mL via INTRAVENOUS

## 2023-01-23 MED ORDER — SODIUM CHLORIDE 0.9 % IV SOLN: Freq: Once | INTRAVENOUS | Status: DC

## 2023-01-23 MED ORDER — INSULIN ASPART 100 UNIT/ML IJ SOLN: 0.0000 [IU] | Freq: Every day | INTRAMUSCULAR | Status: DC

## 2023-01-23 MED ORDER — LACTATED RINGERS IV SOLN: INTRAVENOUS | Status: AC

## 2023-01-23 MED ORDER — ACETAMINOPHEN 325 MG PO TABS: 650.0000 mg | ORAL_TABLET | Freq: Four times a day (QID) | ORAL | Status: DC | PRN

## 2023-01-23 MED ORDER — LACTATED RINGERS IV BOLUS
20.0000 mL/kg | Freq: Once | INTRAVENOUS | Status: AC
  Administered 2023-01-23: 1360 mL via INTRAVENOUS

## 2023-01-23 MED ORDER — POTASSIUM CHLORIDE 10 MEQ/100ML IV SOLN: 10.0000 meq | Freq: Once | INTRAVENOUS | Status: DC

## 2023-01-23 MED ORDER — LISINOPRIL 2.5 MG PO TABS
5.0000 mg | ORAL_TABLET | Freq: Every day | ORAL | Status: DC
  Administered 2023-01-23 – 2023-01-24 (×2): 5 mg via ORAL
  Filled 2023-01-23 (×2): qty 2

## 2023-01-23 MED ORDER — ORAL CARE MOUTH RINSE: 15.0000 mL | OROMUCOSAL | Status: DC | PRN

## 2023-01-23 NOTE — H&P (Signed)
History and Physical    Patient: Glen Merritt ZOX:[id number] DOB: [birth year] DOA: 01/23/2023 DOS: the patient was seen and examined on 01/23/2023 PCP: Rana Snare, DO  Patient coming from: Home  Chief Complaint:  Chief Complaint  Patient presents with   Tachycardia   HPI: Glen Merritt is a 23 y.o. male with medical history significant of hypertension, anxiety, depression, type 1 diabetes, multiple episodes of DKA who  presented to the emergency department due to dyspnea and tachycardia.  He missed a dose of long-acting insulin 2 days ago and has been feeling sick since then.  He is currently using 30 units of Tresiba at bedtime and RI sliding scale with meals.  He last used regular insulin yesterday evening.  He denied fever, chills, rhinorrhea, sore throat, wheezing or hemoptysis.  No chest pain, palpitations, diaphoresis, PND, orthopnea or pitting edema of the lower extremities.  Positive nausea, but no abdominal pain, emesis diarrhea, constipation, melena or hematochezia.  No flank pain, dysuria, frequency or hematuria.  No polyuria, polydipsia, polyphagia or blurred vision.   Lab work: Urinalysis was colorless with a specific gravity of 1.031, greater than 1000 mg/dL glucose and greater than 80 mg/dL ketones.  The rest of the UA was normal.  I-STAT venous blood gas showed a pH of 7.23, pCO2 of 16.3 and pO2 of 55 mmHg.  Bicarbonate was 6.8 and acid-base deficit 18.0 mmol/L.  CBC showed a white count of 12.6 with 61% neutrophils, hemoglobin 15.4 g deciliter platelets 385.  BMP showed sodium 129, potassium 4.2, chloride 92 and CO2 8 mmol/L with an anion gap of 29.  Glucose is 445 mg/dL, normal renal function and calcium level.   ED course: Initial vital signs were temperature 97.8 F, pulse 142, respiration 21, BP 136/120 mmHg and O2 sat 100% on room air.  The patient received 1360 mL of LR bolus and was started on an insulin infusion.  Review of Systems: As mentioned in the history of  present illness. All other systems reviewed and are negative. Past Medical History:  Diagnosis Date   DKA (diabetic ketoacidosis) (HCC) 04/17/2022   History of diabetic ketoacidosis 04/26/2022   Hypertension    Ketonuria 09/29/2014   Type 1 diabetes mellitus (HCC) 10/11/2003   Dx 10/2003.  On a pump in the past   History reviewed. No pertinent surgical history. Social History:  reports that he has never smoked. He has never used smokeless tobacco. He reports current alcohol use. He reports that he does not use drugs.  No Known Allergies  Family History  Problem Relation Age of Onset   Heart disease Maternal Grandmother    Heart disease Maternal Grandfather    Cancer Maternal Grandfather    Obesity Mother    Diabetes Mother    Hypertension Mother     Prior to Admission medications   Medication Sig Start Date End Date Taking? Authorizing Provider  Continuous Glucose Sensor (DEXCOM G7 SENSOR) MISC Use continuously to measure glucose levels. 07/06/22  Yes Carlus Pavlov, MD  insulin degludec (TRESIBA FLEXTOUCH) 200 UNIT/ML FlexTouch Pen Inject 30 Units into the skin daily. 04/26/22  Yes Briscoe Burns, MD  insulin lispro (HUMALOG KWIKPEN) 100 UNIT/ML KwikPen USE AS DIRECTED, INJECT A TOTAL OF 40 UNITS DAILY 10/02/22  Yes Carlus Pavlov, MD  Lancets Misc. (ACCU-CHEK MULTICLIX LANCET DEV) KIT Used to check blood sugars four times daily. 05/17/17  Yes Romero Belling, MD  Blood Glucose Monitoring Suppl (ACCU-CHEK GUIDE) w/Device KIT 1 Device by Does  not apply route 4 (four) times daily. 07/11/18   Romero Belling, MD  glucose blood (ACCU-CHEK GUIDE) test strip Used to check blood sugars four times daily. 05/17/17   Romero Belling, MD  Insulin Pen Needle 32G X 4 MM MISC Use 4x a day 07/06/22   Carlus Pavlov, MD  sertraline (ZOLOFT) 25 MG tablet TAKE 1 TABLET (25 MG TOTAL) BY MOUTH DAILY. Patient not taking: Reported on 07/06/2022 05/23/22 05/23/23  Rana Snare, DO    Physical  Exam: Vitals:   01/23/23 1200 01/23/23 1300 01/23/23 1321 01/23/23 1537  BP: (!) 162/108 (!) 154/101    Pulse: (!) 134 (!) 126    Resp: 18 17    Temp:   98.2 F (36.8 C) 98 F (36.7 C)  TempSrc:   Oral Oral  SpO2: 100% 100%    Weight:    62.2 kg  Height:    5\' 9"  (1.753 m)   Physical Exam Vitals and nursing note reviewed.  Constitutional:      General: He is awake. He is not in acute distress.    Appearance: He is normal weight. He is ill-appearing.  HENT:     Head: Normocephalic.     Nose: No rhinorrhea.     Mouth/Throat:     Mouth: Mucous membranes are dry.  Eyes:     General: No scleral icterus.    Pupils: Pupils are equal, round, and reactive to light.  Neck:     Vascular: No JVD.  Cardiovascular:     Rate and Rhythm: Regular rhythm. Tachycardia present.     Heart sounds: S1 normal and S2 normal.  Pulmonary:     Effort: Pulmonary effort is normal.     Breath sounds: Normal breath sounds.  Abdominal:     General: Bowel sounds are normal. There is no distension.     Palpations: Abdomen is soft.     Tenderness: There is no abdominal tenderness. There is no guarding.  Musculoskeletal:     Cervical back: Neck supple.     Right lower leg: No edema.     Left lower leg: No edema.  Skin:    General: Skin is warm and dry.  Neurological:     General: No focal deficit present.     Mental Status: He is alert and oriented to person, place, and time.  Psychiatric:        Mood and Affect: Mood normal.        Behavior: Behavior normal. Behavior is cooperative.     Data Reviewed:  Results are pending, will review when available.  EKG: Vent. rate 138 BPM PR interval 118 ms QRS duration 77 ms QT/QTcB 307/466 ms P-R-T axes 76 85 -32 Sinus tachycardia Borderline repolarization abnormality  Assessment and Plan: Principal Problem:   Pseudohyponatremia In the setting of:   DKA (diabetic ketoacidosis) (HCC) Observation/stepdown. Keep NPO. Continue IV  fluids. Continue insulin infusion. Monitor CBG closely. BMP every 4 hours. BHA every 8 hours. Replace electrolytes as needed. Consult diabetes coordinator. Transition to SQ insulin per Endo tool.  Active Problems:   Essential hypertension No history according to patient. However, diagnosed with it a years ago in the chart. Not on antihypertensives at home.. Will begin lisinopril 5 mg p.o. daily. Monitor renal function, electrolytes and blood pressure.    Anxiety and depression No SI or HI. No longer on sertraline. Follow-up with PCP as an outpatient.    Advance Care Planning:   Code Status: Full Code  Consults:   Family Communication:   Severity of Illness: The appropriate patient status for this patient is OBSERVATION. Observation status is judged to be reasonable and necessary in order to provide the required intensity of service to ensure the patient's safety. The patient's presenting symptoms, physical exam findings, and initial radiographic and laboratory data in the context of their medical condition is felt to place them at decreased risk for further clinical deterioration. Furthermore, it is anticipated that the patient will be medically stable for discharge from the hospital within 2 midnights of admission.   Author: Bobette Mo, MD 01/23/2023 3:54 PM  For on call review [email or web address].   This document was prepared using Dragon voice recognition software and may contain some unintended transcription errors.

## 2023-01-23 NOTE — ED Notes (Signed)
Glen Merritt with cl called for transport

## 2023-01-23 NOTE — Hospital Course (Signed)
 Marland Kitchen

## 2023-01-23 NOTE — ED Notes (Signed)
EDP Clydie Braun, PA reports no need for BMP until 1300

## 2023-01-23 NOTE — ED Notes (Signed)
Pt ambulatory with steady gait to restroom 

## 2023-01-23 NOTE — ED Provider Notes (Signed)
[facility] EMERGENCY DEPARTMENT AT [facility] Provider Note   CSN: [id number] Arrival date & time: 01/23/23  8295     History  Chief Complaint  Patient presents with   Tachycardia    Glen Merritt is a 23 y.o. male.  Patient complains of an elevated heart rate.  Patient reports that he noticed his heart rate was high yesterday and experienced some shortness of breath.  Patient reports his heart rate has increased today.  Patient is insulin-dependent diabetic type I.  Juvenile diabetic on insulin.  Patient reports that he did miss taking his insulin 2 days ago and has been feeling sick since.  Patient has not been checking his glucose.  Patient denies any fever or chills he has not had any cough or congestion.  Patient denies any nausea or vomiting.  The history is provided by the patient and a friend. No language interpreter was used.       Home Medications Prior to Admission medications   Medication Sig Start Date End Date Taking? Authorizing Provider  Blood Glucose Monitoring Suppl (ACCU-CHEK GUIDE) w/Device KIT 1 Device by Does not apply route 4 (four) times daily. 07/11/18   Romero Belling, MD  Continuous Glucose Sensor (DEXCOM G7 SENSOR) MISC Use continuously to measure glucose levels. 07/06/22   Carlus Pavlov, MD  Glucagon 3 MG/DOSE POWD Place 3 mg into the nose once as needed for up to 1 dose. 02/08/22   Carlus Pavlov, MD  glucose blood (ACCU-CHEK GUIDE) test strip Used to check blood sugars four times daily. 05/17/17   Romero Belling, MD  insulin degludec (TRESIBA FLEXTOUCH) 200 UNIT/ML FlexTouch Pen Inject 30 Units into the skin daily. 04/26/22   Briscoe Burns, MD  insulin lispro (HUMALOG KWIKPEN) 100 UNIT/ML KwikPen USE AS DIRECTED, INJECT A TOTAL OF 40 UNITS DAILY 10/02/22   Carlus Pavlov, MD  Insulin Pen Needle 32G X 4 MM MISC Use 4x a day 07/06/22   Carlus Pavlov, MD  Lancets Misc. (ACCU-CHEK MULTICLIX LANCET DEV) KIT Used to check blood sugars four  times daily. 05/17/17   Romero Belling, MD  sertraline (ZOLOFT) 25 MG tablet TAKE 1 TABLET (25 MG TOTAL) BY MOUTH DAILY. Patient not taking: Reported on 07/06/2022 05/23/22 05/23/23  Rana Snare, DO      Allergies    Patient has no allergy information on record.    Review of Systems   Review of Systems  All other systems reviewed and are negative.   Physical Exam Updated Vital Signs BP (!) 162/108   Pulse (!) 134   Temp 98.2 F (36.8 C) (Oral)   Resp 18   Wt 68 kg   SpO2 100%   BMI 22.15 kg/m  Physical Exam Vitals and nursing note reviewed.  Constitutional:      General: He is not in acute distress.    Appearance: He is well-developed.  HENT:     Head: Normocephalic and atraumatic.  Eyes:     Conjunctiva/sclera: Conjunctivae normal.  Cardiovascular:     Rate and Rhythm: Tachycardia present.     Heart sounds: No murmur heard. Pulmonary:     Effort: Pulmonary effort is normal. No respiratory distress.     Breath sounds: Normal breath sounds.  Abdominal:     Palpations: Abdomen is soft.     Tenderness: There is no abdominal tenderness.  Musculoskeletal:        General: No swelling.     Cervical back: Neck supple.  Skin:  General: Skin is warm and dry.     Capillary Refill: Capillary refill takes less than 2 seconds.  Neurological:     Mental Status: He is alert.  Psychiatric:        Mood and Affect: Mood normal.     ED Results / Procedures / Treatments   Labs (all labs ordered are listed, but only abnormal results are displayed) Labs Reviewed  BASIC METABOLIC PANEL - Abnormal; Notable for the following components:      Result Value   Sodium 129 (*)    Chloride 92 (*)    CO2 8 (*)    Glucose, Bld 445 (*)    Anion gap 29 (*)    All other components within normal limits  BETA-HYDROXYBUTYRIC ACID - Abnormal; Notable for the following components:   Beta-Hydroxybutyric Acid >8.00 (*)    All other components within normal limits  CBC WITH  DIFFERENTIAL/PLATELET - Abnormal; Notable for the following components:   WBC 12.6 (*)    All other components within normal limits  URINALYSIS, ROUTINE W REFLEX MICROSCOPIC - Abnormal; Notable for the following components:   Color, Urine COLORLESS (*)    Specific Gravity, Urine 1.031 (*)    Glucose, UA >1,000 (*)    Ketones, ur >80 (*)    All other components within normal limits  CBG MONITORING, ED - Abnormal; Notable for the following components:   Glucose-Capillary 456 (*)    All other components within normal limits  I-STAT VENOUS BLOOD GAS, ED - Abnormal; Notable for the following components:   pH, Ven 7.230 (*)    pCO2, Ven 16.3 (*)    pO2, Ven 55 (*)    Bicarbonate 6.8 (*)    TCO2 7 (*)    Acid-base deficit 18.0 (*)    Sodium 126 (*)    Calcium, Ion 1.10 (*)    All other components within normal limits  BASIC METABOLIC PANEL  BASIC METABOLIC PANEL  BASIC METABOLIC PANEL  BETA-HYDROXYBUTYRIC ACID  BLOOD GAS, VENOUS  CBG MONITORING, ED    EKG EKG Interpretation Date/Time:  Wednesday January 23 2023 09:28:13 EST Ventricular Rate:  138 PR Interval:  118 QRS Duration:  77 QT Interval:  307 QTC Calculation: 466 R Axis:   85  Text Interpretation: Sinus tachycardia Borderline repolarization abnormality Confirmed by Virgina Norfolk [phone]) on 01/23/2023 9:30:25 AM  Radiology No results found.  Procedures .Critical Care  Performed by: Elson Areas, PA-C Authorized by: Elson Areas, PA-C   Critical care provider statement:    Critical care time (minutes):  30   Critical care start time:  01/23/2023 9:30 PM   Critical care end time:  01/23/2023 1:46 PM   Critical care time was exclusive of:  Separately billable procedures and treating other patients   Critical care was necessary to treat or prevent imminent or life-threatening deterioration of the following conditions:  Circulatory failure, endocrine crisis and dehydration   Critical care was time spent  personally by me on the following activities:  Development of treatment plan with patient or surrogate, discussions with consultants, evaluation of patient's response to treatment, examination of patient, ordering and review of laboratory studies, ordering and review of radiographic studies, ordering and performing treatments and interventions, pulse oximetry, re-evaluation of patient's condition and review of old charts   Care discussed with: admitting provider       Medications Ordered in ED Medications  insulin regular, human (MYXREDLIN) 100 units/ 100 mL infusion (4.4 Units/hr Intravenous  Infusion Verify 01/23/23 1241)  lactated ringers infusion ( Intravenous New Bag/Given 01/23/23 1124)  dextrose 5 % in lactated ringers infusion (has no administration in time range)  dextrose 50 % solution 0-50 mL (has no administration in time range)  lactated ringers bolus 1,360 mL (0 mLs Intravenous Stopped 01/23/23 1311)    ED Course/ Medical Decision Making/ A&P                                 Medical Decision Making Patient reports his heart has been racing since yesterday.  Patient reports missing dosages of insulin 2 days ago.  Amount and/or Complexity of Data Reviewed Independent Historian: friend    Details: Patient is here with her friend who is supportive. External Data Reviewed: notes.    Details: Primary care notes reviewed. Labs: ordered. Decision-making details documented in ED Course.    Details: Labs ordered reviewed and interpreted.  Glucose is 445 anion gap is 29 CO2 is 8 BHa is greater than 8. ECG/medicine tests: ordered and independent interpretation performed. Decision-making details documented in ED Course.    Details: KG shows sinus tachycardia at 138. Discussion of management or test interpretation with external provider(s): Hospitalist consulted for admission.  Risk Prescription drug management. Decision regarding hospitalization. Risk Details: Patient has an elevated  CBG.  Patient started on IV fluids.  Patient's laboratory evaluations returned showing elevated anion gap decreased CO2.  Patient started on Endo tool.  Critical Care Total time providing critical care: 39 minutes           Final Clinical Impression(s) / ED Diagnoses Final diagnoses:  Diabetic ketoacidosis without coma associated with type 1 diabetes mellitus [facility])    Rx / DC Orders ED Discharge Orders     None         Osie Cheeks 01/23/23 1413    Virgina Norfolk, DO 01/23/23 1417

## 2023-01-23 NOTE — ED Triage Notes (Signed)
Pt c/o tachycardia starting yesterday with shob. Pt denies CP, HR 139 in triage

## 2023-01-23 NOTE — Plan of Care (Signed)
  Problem: Clinical Measurements: Goal: Diagnostic test results will improve Outcome: Progressing   Problem: Elimination: Goal: Will not experience complications related to bowel motility Outcome: Progressing Goal: Will not experience complications related to urinary retention Outcome: Progressing   

## 2023-01-23 NOTE — ED Notes (Signed)
RT Note: VBG completed and handed to Dr. Lockie Mola

## 2023-01-23 NOTE — Inpatient Diabetes Management (Signed)
Inpatient Diabetes Program Recommendations  AACE/ADA: New Consensus Statement on Inpatient Glycemic Control (2015)  Target Ranges:  Prepandial:   less than 140 mg/dL      Peak postprandial:   less than 180 mg/dL (1-2 hours)      Critically ill patients:  140 - 180 mg/dL   Lab Results  Component Value Date   GLUCAP 456 (H) 01/23/2023   HGBA1C 11.5 07/06/2022    Review of Glycemic Control  Latest Reference Range & Units 01/23/23 09:37  Glucose-Capillary 70 - 99 mg/dL 295 (H)   Diabetes history: DM 1 Outpatient Diabetes medications:   Tresiba 34 units daily- See's Dr. Elvera Lennox - Humalog  ICR 1:4 Target 130 ISF:  20; max 8-10 correction   Current orders for Inpatient glycemic control:  IV insulin- DKA  Inpatient Diabetes Program Recommendations:    Note patient currently in ED with elevated CBG's/DKA and IV insulin started.  Will follow.   Thanks,  Lorenza Cambridge, RN, BC-ADM Inpatient Diabetes Coordinator Pager [phone]  (8a-5p)

## 2023-01-24 DIAGNOSIS — E1010 Type 1 diabetes mellitus with ketoacidosis without coma: Secondary | ICD-10-CM | POA: Diagnosis not present

## 2023-01-24 LAB — COMPREHENSIVE METABOLIC PANEL
ALT: 29 U/L (ref 0–44)
AST: 27 U/L (ref 15–41)
Albumin: 3.3 g/dL — ABNORMAL LOW (ref 3.5–5.0)
Alkaline Phosphatase: 43 U/L (ref 38–126)
Anion gap: 11 (ref 5–15)
BUN: 10 mg/dL (ref 6–20)
CO2: 20 mmol/L — ABNORMAL LOW (ref 22–32)
Calcium: 9.2 mg/dL (ref 8.9–10.3)
Chloride: 103 mmol/L (ref 98–111)
Creatinine, Ser: 0.67 mg/dL (ref 0.61–1.24)
GFR, Estimated: >60 mL/min (ref >60)
Glucose, Bld: 148 mg/dL — ABNORMAL HIGH (ref 70–99)
Potassium: 3.4 mmol/L — ABNORMAL LOW (ref 3.5–5.1)
Sodium: 134 mmol/L — ABNORMAL LOW (ref 135–145)
Total Bilirubin: 1.1 mg/dL (ref 0.0–1.2)
Total Protein: 6.1 g/dL — ABNORMAL LOW (ref 6.5–8.1)

## 2023-01-24 LAB — CBC
HCT: 37.4 % — ABNORMAL LOW (ref 39.0–52.0)
Hemoglobin: 12.6 g/dL — ABNORMAL LOW (ref 13.0–17.0)
MCH: 31.4 pg (ref 26.0–34.0)
MCHC: 33.7 g/dL (ref 30.0–36.0)
MCV: 93.3 fL (ref 80.0–100.0)
Platelets: 286 10*3/uL (ref 150–400)
RBC: 4.01 MIL/uL — ABNORMAL LOW (ref 4.22–5.81)
RDW: 12.2 % (ref 11.5–15.5)
WBC: 9.1 10*3/uL (ref 4.0–10.5)
nRBC: 0 % (ref 0.0–0.2)

## 2023-01-24 LAB — GLUCOSE, CAPILLARY: Glucose-Capillary: 128 mg/dL — ABNORMAL HIGH (ref 70–99)

## 2023-01-24 NOTE — Plan of Care (Signed)
  Problem: Education: Goal: Knowledge of General Education information will improve Description: Including pain rating scale, medication(s)/side effects and non-pharmacologic comfort measures Outcome: Progressing   Problem: Health Behavior/Discharge Planning: Goal: Ability to manage health-related needs will improve Outcome: Progressing   Problem: Clinical Measurements: Goal: Ability to maintain clinical measurements within normal limits will improve Outcome: Progressing Goal: Will remain free from infection Outcome: Progressing Goal: Diagnostic test results will improve Outcome: Progressing Goal: Respiratory complications will improve Outcome: Progressing Goal: Cardiovascular complication will be avoided Outcome: Progressing   Problem: Activity: Goal: Risk for activity intolerance will decrease Outcome: Progressing   Problem: Nutrition: Goal: Adequate nutrition will be maintained Outcome: Progressing   Problem: Coping: Goal: Level of anxiety will decrease Outcome: Progressing   Problem: Elimination: Goal: Will not experience complications related to bowel motility Outcome: Progressing Goal: Will not experience complications related to urinary retention Outcome: Progressing   Problem: Pain Managment: Goal: General experience of comfort will improve and/or be controlled Outcome: Progressing   Problem: Safety: Goal: Ability to remain free from injury will improve Outcome: Progressing   Problem: Skin Integrity: Goal: Risk for impaired skin integrity will decrease Outcome: Progressing   Problem: Education: Goal: Ability to describe self-care measures that may prevent or decrease complications (Diabetes Survival Skills Education) will improve Outcome: Progressing   Problem: Coping: Goal: Ability to adjust to condition or change in health will improve Outcome: Progressing   Problem: Fluid Volume: Goal: Ability to maintain a balanced intake and output will  improve Outcome: Progressing   Problem: Health Behavior/Discharge Planning: Goal: Ability to identify and utilize available resources and services will improve Outcome: Progressing Goal: Ability to manage health-related needs will improve Outcome: Progressing   Problem: Metabolic: Goal: Ability to maintain appropriate glucose levels will improve Outcome: Progressing   Problem: Nutritional: Goal: Maintenance of adequate nutrition will improve Outcome: Progressing Goal: Progress toward achieving an optimal weight will improve Outcome: Progressing   Problem: Skin Integrity: Goal: Risk for impaired skin integrity will decrease Outcome: Progressing   Problem: Tissue Perfusion: Goal: Adequacy of tissue perfusion will improve Outcome: Progressing   Problem: Education: Goal: Ability to describe self-care measures that may prevent or decrease complications (Diabetes Survival Skills Education) will improve Outcome: Progressing   Problem: Cardiac: Goal: Ability to maintain an adequate cardiac output will improve Outcome: Progressing   Problem: Health Behavior/Discharge Planning: Goal: Ability to identify and utilize available resources and services will improve Outcome: Progressing Goal: Ability to manage health-related needs will improve Outcome: Progressing   Problem: Fluid Volume: Goal: Ability to achieve a balanced intake and output will improve Outcome: Progressing   Problem: Metabolic: Goal: Ability to maintain appropriate glucose levels will improve Outcome: Progressing   Problem: Nutritional: Goal: Maintenance of adequate nutrition will improve Outcome: Progressing Goal: Maintenance of adequate weight for body size and type will improve Outcome: Progressing   Problem: Respiratory: Goal: Will regain and/or maintain adequate ventilation Outcome: Progressing   Problem: Urinary Elimination: Goal: Ability to achieve and maintain adequate renal perfusion and  functioning will improve Outcome: Progressing  Cindy S. Clelia Croft BSN, RN, CCRP, CCRN 01/24/2023 2:12 AM

## 2023-01-24 NOTE — Discharge Summary (Signed)
Physician Discharge Summary  Glen Merritt ZOX:[id number] DOB: November 09, 2000 DOA: 01/23/2023  PCP: Rana Snare, DO  Admit date: 01/23/2023 Discharge date: 01/24/2023 Recommendations for Outpatient Follow-up:  Follow up with PCP in 1 weeks-call for appointment Please obtain BMP/CBC in one week  Discharge Dispo: hOME Discharge Condition: Stable Code Status:   Code Status: Full Code Diet recommendation:  Diet Order             Diet Carb Modified Fluid consistency: Thin; Room service appropriate? Yes  Diet effective now                    Brief/Interim Summary: 23 year old male with diabetes mellitus previous history of DKA last admission 6/29 and left AMA presented to the ED with tachycardia starting 1/21 along with shortness of breath.  In ED heart rate 140> 126, labs with pH 7.2 CO2 16 bicarb 7, glucose 445 anion gap 29 leukocytosis 12.6, BHB more than 8 UA with ketones> consistent with DKA.  He was given IV fluid boluses and started insulin drip and admission requested to [facility].  DKA resolved gap closed bicarb improved and patient was subsequently transition to subcu insulin placed on diet.  At this time is tolerating diet blood pressure heart rate is stable.  He is stable for discharge with plan for outpatient follow-up with PCP.    Discharge Diagnoses:  Principal Problem:   DKA (diabetic ketoacidosis) (HCC) Active Problems:   Essential hypertension   Anxiety and depression   Pseudohyponatremia  DKA Type 2 diabetes mellitus with uncontrolled hyperglycemia: Patient with DKA likely from missing insulin doses which as per him was not intentional.  DKA has resolved, tolerating diet now on basal bolus insulin regimen he already has insulin supplies at home and has a PCP and endocrine follow-up.  He feels well and is being discharged home this morning  Essential hypertension: BP 120-160s>advised outpatient follow-up with PCP Anxiety and depression mood is  stable  Consults: NONE Subjective: AA0X3 tolerated food  Discharge Exam: Vitals:   01/24/23 0900 01/24/23 0927  BP:  (!) 165/78  Pulse: (!) 117   Resp: (!) 22   Temp:    SpO2: 100%    General: Pt is alert, awake, not in acute distress Cardiovascular: RRR, S1/S2 +, no rubs, no gallops Respiratory: CTA bilaterally, no wheezing, no rhonchi Abdominal: Soft, NT, ND, bowel sounds + Extremities: no edema, no cyanosis  Discharge Instructions  Discharge Instructions     Discharge instructions   Complete by: As directed    Please call call MD or return to ER for similar or worsening recurring problem that brought you to hospital or if any fever,nausea/vomiting,abdominal pain, uncontrolled pain, chest pain,  shortness of breath or any other alarming symptoms.  Please follow-up your doctor as instructed in a week time and call the office for appointment.  Please avoid alcohol, smoking, or any other illicit substance and maintain healthy habits including taking your regular medications as prescribed.  You were cared for by a hospitalist during your hospital stay. If you have any questions about your discharge medications or the care you received while you were in the hospital after you are discharged, you can call the unit and ask to speak with the hospitalist on call if the hospitalist that took care of you is not available.  Once you are discharged, your primary care physician will handle any further medical issues. Please note that NO REFILLS for any discharge medications will be authorized once you  are discharged, as it is imperative that you return to your primary care physician (or establish a relationship with a primary care physician if you do not have one) for your aftercare needs so that they can reassess your need for medications and monitor your lab values   Check blood sugar 3 times a day and bedtime at home. If blood sugar running above 200 less than 70 please call your MD to  adjust insulin. If blood sugars running less 100 do not use insulin and call MD. If you noticed signs and symptoms of hypoglycemia or low blood sugar like jitteriness, confusion, thirst, tremor, sweating- Check blood sugar, drink sugary drink/biscuits/sweets to increase sugar level and call MD or return to ER.   Increase activity slowly   Complete by: As directed       Allergies as of 01/24/2023   No Known Allergies      Medication List     TAKE these medications    Accu-Chek Guide w/Device Kit 1 Device by Does not apply route 4 (four) times daily.   Accu-Chek Multiclix Lancet Dev Kit Used to check blood sugars four times daily.   Dexcom G7 Sensor Misc Use continuously to measure glucose levels.   glucose blood test strip Commonly known as: Accu-Chek Guide Used to check blood sugars four times daily.   insulin lispro 100 UNIT/ML KwikPen Commonly known as: HumaLOG KwikPen USE AS DIRECTED, INJECT A TOTAL OF 40 UNITS DAILY   Insulin Pen Needle 32G X 4 MM Misc Use 4x a day   sertraline 25 MG tablet Commonly known as: ZOLOFT TAKE 1 TABLET (25 MG TOTAL) BY MOUTH DAILY.   Evaristo Bury FlexTouch 200 UNIT/ML FlexTouch Pen Generic drug: insulin degludec Inject 30 Units into the skin daily.        Follow-up Information     Rana Snare, DO Follow up in 1 week(s).   Specialty: Internal Medicine Contact information: [address] [place] [address] [phone]                No Known Allergies  The results of significant diagnostics from this hospitalization (including imaging, microbiology, ancillary and laboratory) are listed below for reference.    Microbiology: Recent Results (from the past 240 hours)  MRSA Next Gen by PCR, Nasal     Status: None   Collection Time: 01/23/23  8:24 PM   Specimen: Nasal Mucosa; Nasal Swab  Result Value Ref Range Status   MRSA by PCR Next Gen NOT DETECTED NOT DETECTED Final    Comment: (NOTE) The GeneXpert MRSA Assay  (FDA approved for NASAL specimens only), is one component of a comprehensive MRSA colonization surveillance program. It is not intended to diagnose MRSA infection nor to guide or monitor treatment for MRSA infections. Test performance is not FDA approved in patients less than 14 years old. Performed at [facility], [address], [place], [place] [address]     Procedures/Studies: No results found.  Labs: BNP (last 3 results) No results for input(s): "BNP" in the last 8760 hours. Basic Metabolic Panel: Recent Labs  Lab 01/23/23 0948 01/23/23 1022 01/23/23 1341 01/23/23 1718 01/23/23 2307 01/24/23 0306  NA 129* 126* 131* 132* 133* 134*  K 4.2 4.9 4.3 4.3 3.5 3.4*  CL 92*  --  101 105 102 103  CO2 8*  --  9* 12* 17* 20*  GLUCOSE 445*  --  168* 191* 196* 148*  BUN 19  --  16 13  10 10  CREATININE 1.15  --  0.90 0.85 0.74 0.67  CALCIUM 9.7  --  9.3 8.7* 9.2 9.2   Liver Function Tests: Recent Labs  Lab 01/24/23 0306  AST 27  ALT 29  ALKPHOS 43  BILITOT 1.1  PROT 6.1*  ALBUMIN 3.3*   No results for input(s): "LIPASE", "AMYLASE" in the last 168 hours. No results for input(s): "AMMONIA" in the last 168 hours. CBC: Recent Labs  Lab 01/23/23 0948 01/23/23 1022 01/24/23 0306  WBC 12.6*  --  9.1  NEUTROABS 7.7  --   --   HGB 15.4 14.3 12.6*  HCT 46.3 42.0 37.4*  MCV 94.9  --  93.3  PLT 385  --  286   Cardiac Enzymes: No results for input(s): "CKTOTAL", "CKMB", "CKMBINDEX", "TROPONINI" in the last 168 hours. BNP: Invalid input(s): "POCBNP" CBG: Recent Labs  Lab 01/23/23 1812 01/23/23 1930 01/23/23 2034 01/23/23 2119 01/24/23 0814  GLUCAP 199* 157* 141* 123* 128*   D-Dimer No results for input(s): "DDIMER" in the last 72 hours. Hgb A1c Recent Labs    01/23/23 1718  HGBA1C 11.5*   Lipid Profile No results for input(s): "CHOL", "HDL", "LDLCALC", "TRIG", "CHOLHDL", "LDLDIRECT" in the last 72 hours. Thyroid function studies No  results for input(s): "TSH", "T4TOTAL", "T3FREE", "THYROIDAB" in the last 72 hours.  Invalid input(s): "FREET3" Anemia work up No results for input(s): "VITAMINB12", "FOLATE", "FERRITIN", "TIBC", "IRON", "RETICCTPCT" in the last 72 hours. Urinalysis    Component Value Date/Time   COLORURINE COLORLESS (A) 01/23/2023 0948   APPEARANCEUR CLEAR 01/23/2023 0948   LABSPEC 1.031 (H) 01/23/2023 0948   PHURINE 5.0 01/23/2023 0948   GLUCOSEU >1,000 (A) 01/23/2023 0948   HGBUR NEGATIVE 01/23/2023 0948   BILIRUBINUR NEGATIVE 01/23/2023 0948   KETONESUR >80 (A) 01/23/2023 0948   PROTEINUR NEGATIVE 01/23/2023 0948   UROBILINOGEN 0.2 12/31/2017 1310   NITRITE NEGATIVE 01/23/2023 0948   LEUKOCYTESUR NEGATIVE 01/23/2023 0948   Sepsis Labs Recent Labs  Lab 01/23/23 0948 01/24/23 0306  WBC 12.6* 9.1   Microbiology Recent Results (from the past 240 hours)  MRSA Next Gen by PCR, Nasal     Status: None   Collection Time: 01/23/23  8:24 PM   Specimen: Nasal Mucosa; Nasal Swab  Result Value Ref Range Status   MRSA by PCR Next Gen NOT DETECTED NOT DETECTED Final    Comment: (NOTE) The GeneXpert MRSA Assay (FDA approved for NASAL specimens only), is one component of a comprehensive MRSA colonization surveillance program. It is not intended to diagnose MRSA infection nor to guide or monitor treatment for MRSA infections. Test performance is not FDA approved in patients less than 72 years old. Performed at [facility], [address] [place], [place] [address]      Time coordinating discharge: 25 minutes  SIGNED: Lanae Boast, MD  [facility] 01/24/2023, 11:49 AM  If 7PM-7AM, please contact night-coverage [email or web address]

## 2023-01-24 NOTE — Progress Notes (Signed)
   01/24/23 0835  TOC Brief Assessment  Insurance and Status Reviewed  Patient has primary care physician Yes  Home environment has been reviewed Resides in single family home  Prior level of function: Independent at baseline  Prior/Current Home Services No current home services  Social Drivers of Health Review SDOH reviewed no interventions necessary  Readmission risk has been reviewed Yes  Transition of care needs no transition of care needs at this time

## 2023-01-24 NOTE — Inpatient Diabetes Management (Signed)
Inpatient Diabetes Program Recommendations  AACE/ADA: New Consensus Statement on Inpatient Glycemic Control (2015)  Target Ranges:  Prepandial:   less than 140 mg/dL      Peak postprandial:   less than 180 mg/dL (1-2 hours)      Critically ill patients:  140 - 180 mg/dL   Lab Results  Component Value Date   GLUCAP 128 (H) 01/24/2023   HGBA1C 11.5 (H) 01/23/2023    Review of Glycemic Control  Diabetes history: DM1 Outpatient Diabetes medications: Tresiba 34 daily, Humalog ICR 1:4, Target 130, ISF 20; max 8-10 units Current orders for Inpatient glycemic control: Semglee 30 at bedtime, Novolog 0-15 TID  HgbA1C - 11.5%  Inpatient Diabetes Program Recommendations:    Resume home insulin doses.  Pt had forgotten insulin when he had gone to work yesterday. Pt was discharged before seeing him, left VM on phone to schedule appt with Dr Elvera Lennox (last appt 07/06/22) and start working on getting blood sugars in better control. Goal of HgbA1C < 7%. Hopefully pt will be able to obtain insulin pump for better control.   Thank you. Ailene Ards, RD, LDN, CDCES Inpatient Diabetes Coordinator [phone]

## 2023-02-06 ENCOUNTER — Encounter: Payer: Medicaid Other | Admitting: Student

## 2023-02-06 NOTE — Progress Notes (Deleted)
   CC: ***  HPI:  [name] is a 23 y.o. male with past medical history of type 1 diabetes mellitus with recurrent ketoacidosis, hypertension who presents for hospital follow-up appointment.  Please see assessment and plan for full HPI.  Medications: Diabetes: Tresiba  30 units daily, Humalog  40 units daily? Anxiety/depression: 25 mg daily  Patient was [facility]0 1/23 with concerns of DKA.  Patient was started on insulin  given patient was missing doses of insulin  causing admitted on a DKA.  He improved and was discharged to follow-up outpatient.  Labs at discharge: CMP showed sodium 134, potassium 3.4, bicarb 20, creatinine 0.67 CBC showed hemoglobin 12.6, platelet 286, MCV 93    Past Medical History:  Diagnosis Date   DKA (diabetic ketoacidosis) (HCC) 04/17/2022   History of diabetic ketoacidosis 04/26/2022   Hypertension    Ketonuria 09/29/2014   Type 1 diabetes mellitus (HCC) 10/11/2003   Dx 10/2003.  On a pump in the past     Current Outpatient Medications:    Blood Glucose Monitoring Suppl (ACCU-CHEK GUIDE) w/Device KIT, 1 Device by Does not apply route 4 (four) times daily., Disp: 1 kit, Rfl: 0   Continuous Glucose Sensor (DEXCOM G7 SENSOR) MISC, Use continuously to measure glucose levels., Disp: 6 each, Rfl: 3   glucose blood (ACCU-CHEK GUIDE) test strip, Used to check blood sugars four times daily., Disp: 200 each, Rfl: 11   insulin  degludec (TRESIBA  FLEXTOUCH) 200 UNIT/ML FlexTouch Pen, Inject 30 Units into the skin daily., Disp: 48 mL, Rfl: 3   insulin  lispro (HUMALOG  KWIKPEN) 100 UNIT/ML KwikPen, USE AS DIRECTED, INJECT A TOTAL OF 40 UNITS DAILY, Disp: 45 mL, Rfl: 1   Insulin  Pen Needle 32G X 4 MM MISC, Use 4x a day, Disp: 400 each, Rfl: 3   Lancets Misc. (ACCU-CHEK MULTICLIX LANCET DEV) KIT, Used to check blood sugars four times daily., Disp: 200 each, Rfl: 11   sertraline  (ZOLOFT ) 25 MG tablet, TAKE 1 TABLET (25 MG  TOTAL) BY MOUTH DAILY. (Patient not taking: Reported on 07/06/2022), Disp: 90 tablet, Rfl: 1  Review of Systems:  ***  Constitutional: Eye: Respiratory: Cardiovascular: GI: MSK: GU: Skin: Neuro: Endocrine:   Physical Exam:  There were no vitals filed for this visit. *** General: Patient is sitting comfortably in the room  Eyes: Pupils equal and reactive to light, EOM intact  Head: Normocephalic, atraumatic  Neck: Supple, nontender, full range of motion, No JVD Cardio: Regular rate and rhythm, no murmurs, rubs or gallops. 2+ pulses to bilateral upper and lower extremities  Chest: No chest tenderness Pulmonary: Clear to ausculation bilaterally with no rales, rhonchi, and crackles  Abdomen: Soft, nontender with normoactive bowel sounds with no rebound or guarding  Neuro: Alert and orientated x3. CN II-XII intact. Sensation intact to upper and lower extremities. 2+ patellar reflex.  Back: No midline tenderness, no step off or deformities noted. No paraspinal muscle tenderness.  Skin: No rashes noted  MSK: 5/5 strength to upper and lower extremities.    Assessment & Plan:   No problem-specific Assessment & Plan notes found for this encounter.    Patient {[id number]::discussed with,seen with} Dr. {[id number]::Hlpoonli,Ynqqfjw,Floozw,Wjmzwimj,Tpoopjfd,Cpwrzwu}  Glen Blanch, DO PGY-2 Internal Medicine Resident  Pager: [phone]

## 2023-03-12 ENCOUNTER — Telehealth: Payer: Self-pay | Admitting: *Deleted

## 2023-03-12 ENCOUNTER — Encounter: Payer: Self-pay | Admitting: Student

## 2023-03-12 NOTE — Telephone Encounter (Signed)
 Pt was called to schedule an appt this week ; needs letter for DOT. Call transferred to the [employer]. Appt schedule w/Dr Ninfa Meeker tomorrow 3/12@ 1545PM.

## 2023-03-12 NOTE — Telephone Encounter (Signed)
 Copied from CRM [id number]. Topic: Appointments - Appointment Scheduling >> Mar 12, 2023  1:28 PM Maree Krabbe H wrote: Patient called to get an appointment with Dr. Sherrilee Gilles to get a letter for DOT, patient can't wait till April and would like an appointment this week or next, Patients callback number is [phone].

## 2023-03-13 ENCOUNTER — Ambulatory Visit: Admitting: Student

## 2023-03-13 VITALS — BP 144/88 | HR 111 | Temp 98.4°F | Ht 69.0 in | Wt 151.5 lb

## 2023-03-13 DIAGNOSIS — I10 Essential (primary) hypertension: Secondary | ICD-10-CM

## 2023-03-13 DIAGNOSIS — Z560 Unemployment, unspecified: Secondary | ICD-10-CM | POA: Diagnosis not present

## 2023-03-13 NOTE — Progress Notes (Unsigned)
   CC: Need DOT form for driving  HPI:  Glen.Glen Merritt is a 23 y.o. male living with a history stated below and presents today for pre-work clearance. Please see problem based assessment and plan for additional details.  Past Medical History:  Diagnosis Date   DKA (diabetic ketoacidosis) (HCC) 04/17/2022   History of diabetic ketoacidosis 04/26/2022   Hypertension    Ketonuria 09/29/2014   Type 1 diabetes mellitus (HCC) 10/11/2003   Dx 10/2003.  On a pump in the past    Review of Systems: ROS negative except for what is noted on the assessment and plan.  Vitals:   03/13/23 1607 03/13/23 1623  BP: (!) 146/80 (!) 144/88  Pulse: (!) 124 (!) 111  Temp: 98.4 F (36.9 C)   TempSrc: Oral   SpO2: 99%   Weight: 151 lb 8 oz (68.7 kg)   Height: 5\' 9"  (1.753 m)     Physical Exam: Constitutional: well-appearing man sitting chair in no acute distress HENT: normocephalic atraumatic, mucous membranes moist Cardiovascular: regular rate and rhythm, no m/r/g Pulmonary/Chest: normal work of breathing on room air, lungs clear to auscultation bilaterally Abdominal: soft, non-tender, non-distended MSK: normal bulk and tone Neurological: alert & oriented x 3 Skin: warm and dry Psych: normal mood and behavior   Assessment & Plan:   Patient discussed with Dr. 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 Yesterday he underwent a physical at the Urgent Care on [address] but was told he needed to come here for the driving clearance. I could not complete this task at first as it was unclear what method of driving clearance he needs. Clarification from his Designer, jewellery noted that a simple office letter would suffice. I provided that because I believe he can safely do this job. His works with endocrinology for his  diabetic control.  Essential hypertension Not at goal. I have asked him to make a follow-up soon where we can address his other medical issues.  RTC 1 month for chronic disease follow up.  Katheran James, D.O. [facility] Internal Medicine, PGY-1 Phone: [phone] Date 03/14/2023 Time 1:02 PM

## 2023-03-13 NOTE — Assessment & Plan Note (Addendum)
 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 Yesterday he underwent a physical at the Urgent Care on [address] but was told he needed to come here for the driving clearance.  At present, it is unclear what method of driving clearance he needs, either a simple office letter or a formal DOT form/evaluation. He does not know either and so at present I cannot provide him the documentation. DOT forms online detail a regimented clearance form but it is unclear if this is what he requires. I will attempt to reach out to his hiring contact to clarify.  Could not touch base on 3/12. Will try again on 3/13.

## 2023-03-14 NOTE — Assessment & Plan Note (Signed)
 Not at goal. I have asked him to make a follow-up soon where we can address his other medical issues.

## 2023-03-17 ENCOUNTER — Encounter: Payer: Self-pay | Admitting: Student

## 2023-03-20 NOTE — Progress Notes (Signed)
 Internal Medicine Clinic Attending  I was physically present during the key portions of the resident provided service and participated in the medical decision making of patient's management care. I reviewed pertinent patient test results.  The assessment, diagnosis, and plan were formulated together and I agree with the documentation in the resident's note. Tachycardia noted - not addressed today but should be rechecked at next visit.   Miguel Aschoff, MD

## 2023-03-25 ENCOUNTER — Other Ambulatory Visit: Payer: Self-pay | Admitting: Internal Medicine

## 2023-03-25 DIAGNOSIS — E109 Type 1 diabetes mellitus without complications: Secondary | ICD-10-CM

## 2023-06-14 ENCOUNTER — Ambulatory Visit: Admitting: Student

## 2023-06-14 ENCOUNTER — Encounter: Payer: Self-pay | Admitting: Student

## 2023-06-14 VITALS — BP 139/83 | HR 108 | Temp 98.3°F | Ht 69.0 in | Wt 141.4 lb

## 2023-06-14 DIAGNOSIS — I10 Essential (primary) hypertension: Secondary | ICD-10-CM

## 2023-06-14 DIAGNOSIS — F32A Depression, unspecified: Secondary | ICD-10-CM | POA: Diagnosis not present

## 2023-06-14 DIAGNOSIS — E1042 Type 1 diabetes mellitus with diabetic polyneuropathy: Secondary | ICD-10-CM

## 2023-06-14 DIAGNOSIS — F419 Anxiety disorder, unspecified: Secondary | ICD-10-CM | POA: Diagnosis not present

## 2023-06-14 DIAGNOSIS — E1010 Type 1 diabetes mellitus with ketoacidosis without coma: Secondary | ICD-10-CM | POA: Diagnosis present

## 2023-06-14 LAB — POCT GLYCOSYLATED HEMOGLOBIN (HGB A1C): Hemoglobin A1C: 11.6 % — AB (ref 4.0–5.6)

## 2023-06-14 LAB — GLUCOSE, CAPILLARY: Glucose-Capillary: 331 mg/dL — ABNORMAL HIGH (ref 70–99)

## 2023-06-14 MED ORDER — GABAPENTIN 300 MG PO CAPS: 300.0000 mg | ORAL_CAPSULE | Freq: Three times a day (TID) | ORAL | 2 refills | Status: DC

## 2023-06-14 NOTE — Assessment & Plan Note (Signed)
 Reports tingling and pins and needle sensation of his feet for several months since 2024. No significant sensation in fingers or UE, mainly LE bilaterally. Has uncontrolled T1DM with A1c of 11.6%. Needs to f/u with endocrinology, last seen in 07/2022. Foot exam completed today. Will start gabapentin for peripheral neuropathy, counseled patient on side effect of drowsiness and to start at night to assess for any potential side effects. Normal renal function, no hx of kidney disease.   Plan -Start gabapentin 300 mg at bedtime then titrate up to TID if needed

## 2023-06-14 NOTE — Assessment & Plan Note (Signed)
 BP improved on repeat 139/83. Asymptomatic. Reconsider initiating antihypertensive at next visit. Previously prescribed lisinopril  5 mg but was d/c in 2024 when not taking and normotensive. Pending UACR today. Last BMP in 01/2023 with normal renal function.   Plan -F/u BP at next visit and initiate antihypertensive

## 2023-06-14 NOTE — Assessment & Plan Note (Signed)
 PHQ-2 was 0. Did not fully address today. Was on Zoloft  but no longer taking so reassess at future visit.

## 2023-06-14 NOTE — Progress Notes (Signed)
 CC: overdue f/u visit, nerve pains  HPI:  [name] is a 23 y.o. male living with a history stated below and presents today for nerve pain. Please see problem based assessment and plan for additional details.  Past Medical History:  Diagnosis Date   DKA (diabetic ketoacidosis) (HCC) 04/17/2022   History of diabetic ketoacidosis 04/26/2022   Hypertension    Ketonuria 09/29/2014   Type 1 diabetes mellitus (HCC) 10/11/2003   Dx 10/2003.  On a pump in the past    Current Outpatient Medications on File Prior to Visit  Medication Sig Dispense Refill   Blood Glucose Monitoring Suppl (ACCU-CHEK GUIDE) w/Device KIT 1 Device by Does not apply route 4 (four) times daily. 1 kit 0   Continuous Glucose Sensor (DEXCOM G7 SENSOR) MISC Use continuously to measure glucose levels. 6 each 3   glucose blood (ACCU-CHEK GUIDE) test strip Used to check blood sugars four times daily. 200 each 11   insulin  degludec (TRESIBA  FLEXTOUCH) 200 UNIT/ML FlexTouch Pen Inject 30 Units into the skin daily. 48 mL 3   insulin  lispro (HUMALOG  KWIKPEN) 100 UNIT/ML KwikPen USE AS DIRECTED, INJECT A TOTAL OF 40 UNITS DAILY 30 mL 1   Insulin  Pen Needle 32G X 4 MM MISC Use 4x a day 400 each 3   Lancets Misc. (ACCU-CHEK MULTICLIX LANCET DEV) KIT Used to check blood sugars four times daily. 200 each 11   sertraline  (ZOLOFT ) 25 MG tablet TAKE 1 TABLET (25 MG TOTAL) BY MOUTH DAILY. (Patient not taking: Reported on 07/06/2022) 90 tablet 1   No current facility-administered medications on file prior to visit.    Family History  Problem Relation Age of Onset   Heart disease Maternal Grandmother    Heart disease Maternal Grandfather    Cancer Maternal Grandfather    Obesity Mother    Diabetes Mother    Hypertension Mother     Social History   Socioeconomic History   Marital status: Single    Spouse name: Not on file   Number of children: Not on file   Years of education: Not on file   Highest education level:  Not on file  Occupational History   Not on file  Tobacco Use   Smoking status: Never   Smokeless tobacco: Never  Vaping Use   Vaping status: Every Day  Substance and Sexual Activity   Alcohol use: Yes    Comment: occ   Drug use: Never   Sexual activity: Not on file  Other Topics Concern   Not on file  Social History Narrative   Not on file   Social Drivers of Health   Financial Resource Strain: Not on file  Food Insecurity: No Food Insecurity (01/23/2023)   Hunger Vital Sign    Worried About Running Out of Food in the Last Year: Never true    Ran Out of Food in the Last Year: Never true  Transportation Needs: No Transportation Needs (01/23/2023)   PRAPARE - Administrator, Civil Service (Medical): No    Lack of Transportation (Non-Medical): No  Physical Activity: Not on file  Stress: Not on file  Social Connections: Not on file  Intimate Partner Violence: Not At Risk (01/23/2023)   Humiliation, Afraid, Rape, and Kick questionnaire    Fear of Current or Ex-Partner: No    Emotionally Abused: No    Physically Abused: No    Sexually Abused: No    Review of Systems: ROS negative except for  what is noted on the assessment and plan.  Vitals:   06/14/23 1033 06/14/23 1058  BP: (!) 151/101 139/83  Pulse: (!) 113 (!) 108  Temp: 98.3 F (36.8 C)   TempSrc: Oral   SpO2: 96%   Weight: 141 lb 6.4 oz (64.1 kg)   Height: 5' 9 (1.753 m)    Physical Exam: Constitutional: well-appearing male sitting in chair comfortably, in no acute distress Cardiovascular: regular rhythm, mild/improved tachycardia, 2+ DP pulse bilaterally  Pulmonary/Chest: normal work of breathing on room air Neurological: alert & oriented x 3 Skin: no open wounds/sores/cuts on feet and warm/dry  Psych: pleasant mood   Assessment & Plan:   Essential hypertension BP improved on repeat 139/83. Asymptomatic. Reconsider initiating antihypertensive at next visit. Previously prescribed lisinopril  5  mg but was d/c in 2024 when not taking and normotensive. Pending UACR today. Last BMP in 01/2023 with normal renal function.   Plan -F/u BP at next visit and initiate antihypertensive   Type 1 diabetes mellitus with recurrent ketoacidosis (HCC) Status: uncontrolled. T1DM with last A1c of 11.5 in 01/2023.  Patient's A1c today is 11.6. Follows with [facility] (Dr. Aldona Amel). Currently taking Tresiba  40 units, Humalog  SSI. Wears a Dexcom. Reports avg fasting glucose 250. Denies hypoglycemia, states lowest 70-80s. Foot exam completed today. Last endo OV 07/2022 with plans for insulin  pump. Encouraged patient to reschedule f/u appt with their office now that he is employed. Last hospitalization in 01/2023 was for DKA. Denies symptoms of DKA today including abdominal pain, n/v, SOB, confusion. Feels well today except for ongoing nerve pain, sounds like peripheral neuropathy likely from uncontrolled diabetes, will initiate gabapentin today.   Plan -Continue insulin  regimen per endocrinology  -Encouraged patient to schedule f/u appt with endocrinology -A1c in 3 months  -Ophthalmology exam: referral sent -Urine ACR normal 07/2022, pending repeat  -Start gabapentin 300 mg at bedtime then titrate up to TID if needed   Diabetic peripheral neuropathy associated with type 1 diabetes mellitus (HCC) Reports tingling and pins and needle sensation of his feet for several months since 2024. No significant sensation in fingers or UE, mainly LE bilaterally. Has uncontrolled T1DM with A1c of 11.6%. Needs to f/u with endocrinology, last seen in 07/2022. Foot exam completed today. Will start gabapentin for peripheral neuropathy, counseled patient on side effect of drowsiness and to start at night to assess for any potential side effects. Normal renal function, no hx of kidney disease.   Plan -Start gabapentin 300 mg at bedtime then titrate up to TID if needed  Anxiety and depression PHQ-2 was 0. Did not fully address  today. Was on Zoloft  but no longer taking so reassess at future visit.    Patient discussed with Dr. Nelson Bandy, D.O. [facility] Internal Medicine, PGY-2 Phone: [phone] Date 06/14/2023 Time 3:25 PM

## 2023-06-14 NOTE — Assessment & Plan Note (Addendum)
 Status: uncontrolled. T1DM with last A1c of 11.5 in 01/2023.  Patient's A1c today is 11.6. Follows with [facility] (Dr. Aldona Amel). Currently taking Tresiba  40 units, Humalog  SSI. Wears a Dexcom. Reports avg fasting glucose 250. Denies hypoglycemia, states lowest 70-80s. Foot exam completed today. Last endo OV 07/2022 with plans for insulin  pump. Encouraged patient to reschedule f/u appt with their office now that he is employed. Last hospitalization in 01/2023 was for DKA. Denies symptoms of DKA today including abdominal pain, n/v, SOB, confusion. Feels well today except for ongoing nerve pain, sounds like peripheral neuropathy likely from uncontrolled diabetes, will initiate gabapentin today.   Plan -Continue insulin  regimen per endocrinology  -Encouraged patient to schedule f/u appt with endocrinology -A1c in 3 months  -Ophthalmology exam: referral sent -Urine ACR normal 07/2022, pending repeat  -Start gabapentin 300 mg at bedtime then titrate up to TID if needed

## 2023-06-14 NOTE — Patient Instructions (Addendum)
 Thank you, [name] for allowing us  to provide your care today. Today we discussed   -A1c at 11.6% still.  -Gabapentin 300 mg up to 3 times a day for nerve pain. (Can cause drowsiness/sleepiness so be cautious) -Referral for eye exam -Urine sample for diabetes, will call with results   -Follow up with your endocrinologist: Dr. Ardena Koyanagi [facility] [address] [place], [place] [address] [phone]   Follow up: 1 month    Should you have any questions or concerns please call the internal medicine clinic at [phone].    [name], D.O. [facility]

## 2023-06-16 ENCOUNTER — Ambulatory Visit: Payer: Self-pay | Admitting: Student

## 2023-06-16 LAB — MICROALBUMIN / CREATININE URINE RATIO
Creatinine, Urine: 47.2 mg/dL
Microalb/Creat Ratio: 21 mg/g{creat} (ref 0–29)
Microalbumin, Urine: 9.7 ug/mL

## 2023-06-21 NOTE — Progress Notes (Signed)
 Internal Medicine Clinic Attending  Case discussed with the resident at the time of the visit.  We reviewed the resident's history and exam and pertinent patient test results.  I agree with the assessment, diagnosis, and plan of care documented in the resident's note.

## 2023-07-11 ENCOUNTER — Telehealth: Payer: Self-pay | Admitting: Dietician

## 2023-07-18 NOTE — Telephone Encounter (Signed)
 Left voicemail for return call

## 2023-07-21 ENCOUNTER — Other Ambulatory Visit: Payer: Self-pay | Admitting: Internal Medicine

## 2023-07-21 DIAGNOSIS — E109 Type 1 diabetes mellitus without complications: Secondary | ICD-10-CM

## 2023-09-17 ENCOUNTER — Other Ambulatory Visit: Payer: Self-pay

## 2023-09-17 DIAGNOSIS — E1042 Type 1 diabetes mellitus with diabetic polyneuropathy: Secondary | ICD-10-CM

## 2023-09-17 MED ORDER — GABAPENTIN 300 MG PO CAPS: 300.0000 mg | ORAL_CAPSULE | Freq: Three times a day (TID) | ORAL | 2 refills | Status: DC

## 2023-09-25 ENCOUNTER — Encounter: Payer: Self-pay | Admitting: Dietician

## 2023-09-25 ENCOUNTER — Ambulatory Visit: Admitting: Student

## 2023-09-25 VITALS — BP 161/100 | HR 99 | Temp 98.3°F | Ht 69.0 in | Wt 148.8 lb

## 2023-09-25 DIAGNOSIS — E1010 Type 1 diabetes mellitus with ketoacidosis without coma: Secondary | ICD-10-CM | POA: Diagnosis not present

## 2023-09-25 DIAGNOSIS — Z0000 Encounter for general adult medical examination without abnormal findings: Secondary | ICD-10-CM | POA: Diagnosis not present

## 2023-09-25 DIAGNOSIS — E1042 Type 1 diabetes mellitus with diabetic polyneuropathy: Secondary | ICD-10-CM | POA: Diagnosis not present

## 2023-09-25 DIAGNOSIS — I10 Essential (primary) hypertension: Secondary | ICD-10-CM | POA: Diagnosis present

## 2023-09-25 LAB — POCT GLYCOSYLATED HEMOGLOBIN (HGB A1C): HbA1c, POC (controlled diabetic range): 10.5 % — AB (ref 0.0–7.0)

## 2023-09-25 LAB — GLUCOSE, CAPILLARY: Glucose-Capillary: 299 mg/dL — ABNORMAL HIGH (ref 70–99)

## 2023-09-25 MED ORDER — GABAPENTIN 300 MG PO CAPS: ORAL_CAPSULE | ORAL | 5 refills | Status: AC

## 2023-09-25 MED ORDER — LISINOPRIL 20 MG PO TABS: 20.0000 mg | ORAL_TABLET | Freq: Every day | ORAL | 11 refills | Status: AC

## 2023-09-25 NOTE — Progress Notes (Signed)
 CC: routine visit  HPI: [name] is a 23 y.o. male living with a history stated below and presents today for routine visit. Please see problem based assessment and plan for additional details.  Past Medical History:  Diagnosis Date   DKA (diabetic ketoacidosis) (HCC) 04/17/2022   History of diabetic ketoacidosis 04/26/2022   Hypertension    Ketonuria 09/29/2014   Type 1 diabetes mellitus (HCC) 10/11/2003   Dx 10/2003.  On a pump in the past    Current Outpatient Medications on File Prior to Visit  Medication Sig Dispense Refill   Blood Glucose Monitoring Suppl (ACCU-CHEK GUIDE) w/Device KIT 1 Device by Does not apply route 4 (four) times daily. 1 kit 0   Continuous Glucose Sensor (DEXCOM G7 SENSOR) MISC Use continuously to measure glucose levels. 6 each 3   glucose blood (ACCU-CHEK GUIDE) test strip Used to check blood sugars four times daily. 200 each 11   insulin  degludec (TRESIBA  FLEXTOUCH) 200 UNIT/ML FlexTouch Pen Inject 30 Units into the skin daily. 48 mL 3   insulin  lispro (HUMALOG  KWIKPEN) 100 UNIT/ML KwikPen USE AS DIRECTED, INJECT A TOTAL OF 40 UNITS DAILY 30 mL 1   Insulin  Pen Needle 32G X 4 MM MISC Use 4x a day 400 each 3   Lancets Misc. (ACCU-CHEK MULTICLIX LANCET DEV) KIT Used to check blood sugars four times daily. 200 each 11   sertraline  (ZOLOFT ) 25 MG tablet TAKE 1 TABLET (25 MG TOTAL) BY MOUTH DAILY. (Patient not taking: Reported on 07/06/2022) 90 tablet 1   No current facility-administered medications on file prior to visit.    Family History  Problem Relation Age of Onset   Heart disease Maternal Grandmother    Heart disease Maternal Grandfather    Cancer Maternal Grandfather    Obesity Mother    Diabetes Mother    Hypertension Mother     Social History   Socioeconomic History   Marital status: Single    Spouse name: Not on file   Number of children: Not on file   Years of education: Not on file   Highest education level: Not on file   Occupational History   Not on file  Tobacco Use   Smoking status: Never   Smokeless tobacco: Never  Vaping Use   Vaping status: Every Day  Substance and Sexual Activity   Alcohol use: Yes    Comment: occ   Drug use: Never   Sexual activity: Not on file  Other Topics Concern   Not on file  Social History Narrative   Not on file   Social Drivers of Health   Financial Resource Strain: Not on file  Food Insecurity: No Food Insecurity (01/23/2023)   Hunger Vital Sign    Worried About Running Out of Food in the Last Year: Never true    Ran Out of Food in the Last Year: Never true  Transportation Needs: No Transportation Needs (01/23/2023)   PRAPARE - Administrator, Civil Service (Medical): No    Lack of Transportation (Non-Medical): No  Physical Activity: Not on file  Stress: Not on file  Social Connections: Not on file  Intimate Partner Violence: Not At Risk (01/23/2023)   Humiliation, Afraid, Rape, and Kick questionnaire    Fear of Current or Ex-Partner: No    Emotionally Abused: No    Physically Abused: No    Sexually Abused: No    Review of Systems: ROS negative except for what is noted on  the assessment and plan.  Vitals:   09/25/23 1542 09/25/23 1610  BP: (!) 159/104 (!) 161/100  Pulse: (!) 109 99  Temp: 98.3 F (36.8 C)   TempSrc: Oral   SpO2: 98%   Weight: 148 lb 12.8 oz (67.5 kg)   Height: 5' 9 (1.753 m)    Physical Exam: Constitutional: well-appearing male sitting in chair comfortably, in no acute distress Cardiovascular: regular rhythm, mild tachycardia, no murmurs appreciated Pulmonary/Chest: normal work of breathing on room air, lungs clear to auscultation bilaterally Neurological: alert & oriented x 3 Psych: normal mood and affect   Assessment & Plan:   Assessment & Plan Essential hypertension BP today 159/104, remains elevated on repeat. Asymptomatic. Was on lisinopril  in past and agreeable to restarting this. BMP in 01/2023 with  normal renal function. Normal UACR in 06/2023.  Plan -Start lisinopril  20 mg daily -BMP today -1 month f/u for BP check and repeat BMP    Diabetic peripheral neuropathy associated with type 1 diabetes mellitus (HCC) Reports improvement of his neuropathy but still has some lingering pain. Taking gabapentin  300 mg TID. Normal renal function. Discussed and agreed on 300 mg in AM and afternoon and 600 mg at bedtime.   Plan -Increase gabapentin  to 300 mg in morning, afternoon and 600 mg at bedtime (new Rx sent) Type 1 diabetes mellitus with recurrent ketoacidosis (HCC) Status: uncontrolled. T1DM with last A1c of 11.6 in 06/2023.  Patient's A1c today is 10.5. Follows with [facility] (Dr. Trixie). Currently taking Tresiba  40 units, Humalog  SSI. Wears a Dexcom. Denies hypoglycemia. Reports fasting glucose 130-150. Has not seen endocrinology since last year, encouraged patient to reschedule to be seen for plans of insulin  pump. No signs of DKA today and feeling well.    Plan -Continue insulin  regimen per endocrinology  -Encouraged patient to schedule f/u appt with endocrinology -A1c in 3 months  -Ophthalmology exam: referral sent  -Urine ACR normal, 06/2023 Healthcare maintenance Deferred flu shot today. HCV Ab testing done in 07/2023 and non-reactive. Other immunizations now populated under immunizations tab.     Return in about 4 weeks (around 10/23/2023) for blood pressure .   Patient discussed with Dr. Karna Ozell Nearing, D.O. [facility] Internal Medicine, PGY-3 Phone: [phone] Date 09/25/2023 Time 5:46 PM

## 2023-09-25 NOTE — Patient Instructions (Addendum)
 Thank you, [name] for allowing us  to provide your care today. Today we discussed:  -Blood pressure elevated. Will start lisinopril  20 mg daily. Plan to see you back in 1 month for blood pressure check and blood work -Will increase gabapentin  for neuropathy to 300 mg in morning, 300 mg in afternoon, and 600 mg in the evening/bedtime.   I have ordered the following medication/changed the following medications:  Start the following medications: Meds ordered this encounter  Medications   lisinopril  (ZESTRIL ) 20 MG tablet    Sig: Take 1 tablet (20 mg total) by mouth daily.    Dispense:  30 tablet    Refill:  11   gabapentin  (NEURONTIN ) 300 MG capsule    Sig: Take 1 capsule (300 mg total) by mouth in the morning and afternoon. Take 2 capsule (600 mg total) by mouth at bedtime.    Dispense:  120 capsule    Refill:  5     Follow up: 1 month   Should you have any questions or concerns please call the internal medicine clinic at [phone].    Alexandro Line, D.O. [facility]

## 2023-09-25 NOTE — Assessment & Plan Note (Signed)
 BP today 159/104, remains elevated on repeat. Asymptomatic. Was on lisinopril  in past and agreeable to restarting this. BMP in 01/2023 with normal renal function. Normal UACR in 06/2023.  Plan -Start lisinopril  20 mg daily -BMP today -1 month f/u for BP check and repeat BMP

## 2023-09-25 NOTE — Assessment & Plan Note (Signed)
 Reports improvement of his neuropathy but still has some lingering pain. Taking gabapentin  300 mg TID. Normal renal function. Discussed and agreed on 300 mg in AM and afternoon and 600 mg at bedtime.   Plan -Increase gabapentin  to 300 mg in morning, afternoon and 600 mg at bedtime (new Rx sent)

## 2023-09-25 NOTE — Assessment & Plan Note (Signed)
 Deferred flu shot today. HCV Ab testing done in 07/2023 and non-reactive. Other immunizations now populated under immunizations tab.

## 2023-09-25 NOTE — Assessment & Plan Note (Signed)
 Status: uncontrolled. T1DM with last A1c of 11.6 in 06/2023.  Patient's A1c today is 10.5. Follows with [facility] (Dr. Trixie). Currently taking Tresiba  40 units, Humalog  SSI. Wears a Dexcom. Denies hypoglycemia. Reports fasting glucose 130-150. Has not seen endocrinology since last year, encouraged patient to reschedule to be seen for plans of insulin  pump. No signs of DKA today and feeling well.    Plan -Continue insulin  regimen per endocrinology  -Encouraged patient to schedule f/u appt with endocrinology -A1c in 3 months  -Ophthalmology exam: referral sent  -Urine ACR normal, 06/2023

## 2023-09-26 ENCOUNTER — Ambulatory Visit: Payer: Self-pay | Admitting: Student

## 2023-09-26 LAB — BASIC METABOLIC PANEL WITH GFR
BUN/Creatinine Ratio: 16 (ref 9–20)
BUN: 13 mg/dL (ref 6–20)
CO2: 20 mmol/L (ref 20–29)
Calcium: 9.6 mg/dL (ref 8.7–10.2)
Chloride: 97 mmol/L (ref 96–106)
Creatinine, Ser: 0.82 mg/dL (ref 0.76–1.27)
Glucose: 381 mg/dL — ABNORMAL HIGH (ref 70–99)
Potassium: 4.2 mmol/L (ref 3.5–5.2)
Sodium: 135 mmol/L (ref 134–144)
eGFR: 125 mL/min/1.73 (ref >59)

## 2023-09-26 LAB — TSH: TSH: 1.62 u[IU]/mL (ref 0.450–4.500)

## 2023-09-26 NOTE — Progress Notes (Signed)
 Internal Medicine Clinic Attending  Case discussed with the resident at the time of the visit.  We reviewed the resident's history and exam and pertinent patient test results.  I agree with the assessment, diagnosis, and plan of care documented in the resident's note.

## 2023-10-10 ENCOUNTER — Encounter (HOSPITAL_COMMUNITY): Payer: Self-pay | Admitting: *Deleted

## 2023-10-10 ENCOUNTER — Emergency Department (HOSPITAL_COMMUNITY): Admission: EM | Admit: 2023-10-10 | Discharge: 2023-10-10 | Discharge status: Against Medical Advice

## 2023-10-10 ENCOUNTER — Other Ambulatory Visit: Payer: Self-pay

## 2023-10-10 DIAGNOSIS — E1010 Type 1 diabetes mellitus with ketoacidosis without coma: Secondary | ICD-10-CM | POA: Insufficient documentation

## 2023-10-10 DIAGNOSIS — Z794 Long term (current) use of insulin: Secondary | ICD-10-CM | POA: Diagnosis not present

## 2023-10-10 DIAGNOSIS — R109 Unspecified abdominal pain: Secondary | ICD-10-CM | POA: Diagnosis present

## 2023-10-10 LAB — COMPREHENSIVE METABOLIC PANEL WITH GFR
ALT: 28 U/L (ref 0–44)
AST: 28 U/L (ref 15–41)
Albumin: 4 g/dL (ref 3.5–5.0)
Alkaline Phosphatase: 59 U/L (ref 38–126)
Anion gap: 19 — ABNORMAL HIGH (ref 5–15)
BUN: 10 mg/dL (ref 6–20)
CO2: 14 mmol/L — ABNORMAL LOW (ref 22–32)
Calcium: 9.1 mg/dL (ref 8.9–10.3)
Chloride: 100 mmol/L (ref 98–111)
Creatinine, Ser: 1.19 mg/dL (ref 0.61–1.24)
GFR, Estimated: >60 mL/min (ref >60)
Glucose, Bld: 266 mg/dL — ABNORMAL HIGH (ref 70–99)
Potassium: 4.1 mmol/L (ref 3.5–5.1)
Sodium: 133 mmol/L — ABNORMAL LOW (ref 135–145)
Total Bilirubin: 2 mg/dL — ABNORMAL HIGH (ref 0.0–1.2)
Total Protein: 7 g/dL (ref 6.5–8.1)

## 2023-10-10 LAB — I-STAT CHEM 8, ED
BUN: 10 mg/dL (ref 6–20)
Calcium, Ion: 1.16 mmol/L (ref 1.15–1.40)
Chloride: 102 mmol/L (ref 98–111)
Creatinine, Ser: 0.6 mg/dL — ABNORMAL LOW (ref 0.61–1.24)
Glucose, Bld: 440 mg/dL — ABNORMAL HIGH (ref 70–99)
HCT: 46 % (ref 39.0–52.0)
Hemoglobin: 15.6 g/dL (ref 13.0–17.0)
Potassium: 3.7 mmol/L (ref 3.5–5.1)
Sodium: 134 mmol/L — ABNORMAL LOW (ref 135–145)
TCO2: 14 mmol/L — ABNORMAL LOW (ref 22–32)

## 2023-10-10 LAB — CBC
HCT: 43 % (ref 39.0–52.0)
Hemoglobin: 14.3 g/dL (ref 13.0–17.0)
MCH: 31 pg (ref 26.0–34.0)
MCHC: 33.3 g/dL (ref 30.0–36.0)
MCV: 93.1 fL (ref 80.0–100.0)
Platelets: 298 K/uL (ref 150–400)
RBC: 4.62 MIL/uL (ref 4.22–5.81)
RDW: 11.4 % — ABNORMAL LOW (ref 11.5–15.5)
WBC: 7.5 K/uL (ref 4.0–10.5)
nRBC: 0 % (ref 0.0–0.2)

## 2023-10-10 LAB — CBG MONITORING, ED
Glucose-Capillary: 385 mg/dL — ABNORMAL HIGH (ref 70–99)
Glucose-Capillary: 205 mg/dL — ABNORMAL HIGH (ref 70–99)
Glucose-Capillary: 233 mg/dL — ABNORMAL HIGH (ref 70–99)
Glucose-Capillary: 162 mg/dL — ABNORMAL HIGH (ref 70–99)
Glucose-Capillary: 153 mg/dL — ABNORMAL HIGH (ref 70–99)
Glucose-Capillary: 164 mg/dL — ABNORMAL HIGH (ref 70–99)
Glucose-Capillary: 141 mg/dL — ABNORMAL HIGH (ref 70–99)

## 2023-10-10 LAB — I-STAT VENOUS BLOOD GAS, ED
Acid-base deficit: 9 mmol/L — ABNORMAL HIGH (ref 0.0–2.0)
Bicarbonate: 15.6 mmol/L — ABNORMAL LOW (ref 20.0–28.0)
Calcium, Ion: 1.22 mmol/L (ref 1.15–1.40)
HCT: 43 % (ref 39.0–52.0)
Hemoglobin: 14.6 g/dL (ref 13.0–17.0)
O2 Saturation: 92 %
Potassium: 4.1 mmol/L (ref 3.5–5.1)
Sodium: 134 mmol/L — ABNORMAL LOW (ref 135–145)
TCO2: 16 mmol/L — ABNORMAL LOW (ref 22–32)
pCO2, Ven: 29.3 mmHg — ABNORMAL LOW (ref 44–60)
pH, Ven: 7.333 (ref 7.25–7.43)
pO2, Ven: 66 mmHg — ABNORMAL HIGH (ref 32–45)

## 2023-10-10 LAB — BASIC METABOLIC PANEL WITH GFR
Anion gap: 11 (ref 5–15)
BUN: 7 mg/dL (ref 6–20)
CO2: 19 mmol/L — ABNORMAL LOW (ref 22–32)
Calcium: 8.6 mg/dL — ABNORMAL LOW (ref 8.9–10.3)
Chloride: 103 mmol/L (ref 98–111)
Creatinine, Ser: 0.79 mg/dL (ref 0.61–1.24)
GFR, Estimated: >60 mL/min (ref >60)
Glucose, Bld: 149 mg/dL — ABNORMAL HIGH (ref 70–99)
Potassium: 4.1 mmol/L (ref 3.5–5.1)
Sodium: 133 mmol/L — ABNORMAL LOW (ref 135–145)

## 2023-10-10 LAB — URINALYSIS, ROUTINE W REFLEX MICROSCOPIC
Bacteria, UA: NONE SEEN
Bilirubin Urine: NEGATIVE
Glucose, UA: >=500 mg/dL — AB
Hgb urine dipstick: NEGATIVE
Ketones, ur: 80 mg/dL — AB
Leukocytes,Ua: NEGATIVE
Nitrite: NEGATIVE
Protein, ur: NEGATIVE mg/dL
Specific Gravity, Urine: 1.029 (ref 1.005–1.030)
pH: 5 (ref 5.0–8.0)

## 2023-10-10 LAB — BETA-HYDROXYBUTYRIC ACID
Beta-Hydroxybutyric Acid: 4.45 mmol/L — ABNORMAL HIGH (ref 0.05–0.27)
Beta-Hydroxybutyric Acid: 2.15 mmol/L — ABNORMAL HIGH (ref 0.05–0.27)

## 2023-10-10 MED ORDER — INSULIN REGULAR(HUMAN) IN NACL 100-0.9 UT/100ML-% IV SOLN
INTRAVENOUS | Status: DC
  Administered 2023-10-10: 4.6 [IU]/h via INTRAVENOUS
  Filled 2023-10-10: qty 100

## 2023-10-10 MED ORDER — POTASSIUM CHLORIDE 10 MEQ/100ML IV SOLN
10.0000 meq | INTRAVENOUS | Status: AC
  Administered 2023-10-10 (×2): 10 meq via INTRAVENOUS
  Filled 2023-10-10 (×2): qty 100

## 2023-10-10 MED ORDER — LACTATED RINGERS IV SOLN: INTRAVENOUS | Status: DC

## 2023-10-10 MED ORDER — DEXTROSE IN LACTATED RINGERS 5 % IV SOLN: INTRAVENOUS | Status: DC

## 2023-10-10 MED ORDER — LACTATED RINGERS IV BOLUS
2000.0000 mL | Freq: Once | INTRAVENOUS | Status: AC
  Administered 2023-10-10: 2000 mL via INTRAVENOUS

## 2023-10-10 MED ORDER — DEXTROSE 50 % IV SOLN: 0.0000 mL | INTRAVENOUS | Status: DC | PRN

## 2023-10-10 NOTE — Discharge Instructions (Addendum)
 Your labs suggest that you are in DKA today, but after IV fluids and insulin  drip your labs are continuing to improve.    Since you are not able to stay in the hospital today for admission as recommended it is very important that you check your blood sugar regularly and continue to administer your insulin  as you typically would.    If you develop any abdominal pain, vomiting, confusion or other new or concerning symptoms you should immediately return to the emergency department as DKA can be life-threatening.  Call to schedule close follow up with your PCP

## 2023-10-10 NOTE — ED Notes (Signed)
 Pt requested to leave AMA. Education provided, pt insists on leaving. MD aware. AMA paperwork signed.

## 2023-10-10 NOTE — Inpatient Diabetes Management (Signed)
 Inpatient Diabetes Program Recommendations  AACE/ADA: New Consensus Statement on Inpatient Glycemic Control (2015)  Target Ranges:  Prepandial:   less than 140 mg/dL      Peak postprandial:   less than 180 mg/dL (1-2 hours)      Critically ill patients:  140 - 180 mg/dL   Lab Results  Component Value Date   GLUCAP 233 (H) 10/10/2023   HGBA1C 10.5 (A) 09/25/2023    Review of Glycemic Control  Latest Reference Range & Units 10/10/23 09:17  Beta-Hydroxybutyric Acid 0.05 - 0.27 mmol/L 4.45 (H)    Latest Reference Range & Units 10/10/23 08:12  Ketones, ur NEGATIVE mg/dL 80 !    Latest Reference Range & Units 10/10/23 09:17  CO2 22 - 32 mmol/L 14 (L)  Glucose 70 - 99 mg/dL 733 (H)  BUN 6 - 20 mg/dL 10  Creatinine 9.38 - 8.75 mg/dL 8.80  Calcium 8.9 - 89.6 mg/dL 9.1  Anion gap 5 - 15  19 (H)    Latest Reference Range & Units 09/25/23 15:47  HbA1c, POC (controlled diabetic range) 0.0 - 7.0 % 10.5 !   Diabetes history: DM type 1 Diagnosed 2005 Outpatient Diabetes medications: Tresiba  30 units, Humalog  up to 40 units/day, Dexcom G7 CGM Current orders for Inpatient glycemic control:  IV insulin /Endotool/DKA  PCP: internal medicine clinic Endocrinologist: Dr. Trixie, has had 2 visits with her (02/08/2022, 07/06/2022) then before that was with Dr. Kassie in the same practice  Insulin  doses at that time per Dr. Trixie: Please increase: - Tresiba  34 units daily   Continue: - Humalog   ICR 1:4 Target 130 ISF:  20; max 8-10 correction Hypoglycemia unawareness  Pt stated recently tried to call for an Endo appointment but was referred to have a visit with a new PCP??  Spoke with pt about follow up and glucose control at home. Told pt to send private mychart message to Dr. Trixie and or call office again and see if they will continue to see him at [facility] endocrinology, may need another referral from PCP at internal medicine clinic. I told him if he is unable to see an Endocrinologist  soon that his insulin  needs to be titrated by his PCP and he needs to let them know that.  Pt reports eating a lot of carbs but not gaining weight. Counseled pt on balanced diet. Told pt his insulin  doses are not enough and hyperglycemia is preventing the weight gain from unbalanced diet.  Thanks,  Clotilda Bull RN, MSN, BC-ADM Inpatient Diabetes Coordinator Team Pager [phone] (8a-5p)

## 2023-10-10 NOTE — ED Triage Notes (Signed)
 Pt. Stated, Jesus had stomach pain x 1 day. . Pt has diabetes.

## 2023-10-10 NOTE — ED Notes (Signed)
 Pt ambulated to bathroom independently

## 2023-10-10 NOTE — Hospital Course (Addendum)
 Abdominal pain Diabetes Dka Htn

## 2023-10-10 NOTE — ED Notes (Signed)
 CCMD contacted.

## 2023-10-10 NOTE — ED Triage Notes (Signed)
 Pt states that he thinks he is having issues with his blood sugar.  He states that he has been tired since yesterday and he has been having abdominal pain.  Pt is type 1 diabetic and his last CBG at home was 298 and he feels like he is a little dehydrated.

## 2023-10-10 NOTE — ED Provider Notes (Signed)
 [place] EMERGENCY DEPARTMENT AT [facility] Provider Note   CSN: [id number] Arrival date & time: 10/10/23  9241     Patient presents with: Abdominal Pain and Hyperglycemia   Glen Merritt is a 23 y.o. male.   Glen Merritt is a 23 y.o. male with a history of type 1 diabetes, who presents to the ED reporting some abdominal pain and trouble controlling his blood sugar.  He reports that yesterday he started having some mild abdominal pain and nausea without vomiting and started to feel unwell and noticed that his blood sugar was a bit higher than usual, was 298 this morning.  He has not missed any doses of his insulin  but was concerned that he might be developing DKA.  He denies any recent illness, has been compliant with medications with no significant changes to his meds or diet.  He does report recently returning from a trip to [place] on Tuesday where he ate and drink a bit more than usual.  The history is provided by the patient and medical records.  Abdominal Pain Associated symptoms: nausea   Associated symptoms: no chills, no cough, no dysuria, no fatigue, no shortness of breath and no vomiting   Hyperglycemia Associated symptoms: abdominal pain and nausea   Associated symptoms: no dysuria, no fatigue, no shortness of breath and no vomiting        Prior to Admission medications   Medication Sig Start Date End Date Taking? Authorizing Provider  gabapentin  (NEURONTIN ) 300 MG capsule Take 1 capsule (300 mg total) by mouth in the morning and afternoon. Take 2 capsule (600 mg total) by mouth at bedtime. 09/25/23  Yes Zheng, Michael, DO  insulin  degludec (TRESIBA  FLEXTOUCH) 200 UNIT/ML FlexTouch Pen Inject 30 Units into the skin daily. 04/26/22  Yes Jinwala, Sagar H, MD  insulin  lispro (HUMALOG  KWIKPEN) 100 UNIT/ML KwikPen USE AS DIRECTED, INJECT A TOTAL OF 40 UNITS DAILY Patient taking differently: Inject 60 Units into the skin daily. USE AS DIRECTED, INJECT A TOTAL  OF 40 UNITS DAILY 07/22/23  Yes Trixie File, MD  lisinopril  (ZESTRIL ) 20 MG tablet Take 1 tablet (20 mg total) by mouth daily. 09/25/23 09/24/24 Yes Zheng, Michael, DO  Blood Glucose Monitoring Suppl (ACCU-CHEK GUIDE) w/Device KIT 1 Device by Does not apply route 4 (four) times daily. 07/11/18   Kassie Mallick, MD  Continuous Glucose Sensor (DEXCOM G7 SENSOR) MISC Use continuously to measure glucose levels. 07/06/22   Trixie File, MD  glucose blood (ACCU-CHEK GUIDE) test strip Used to check blood sugars four times daily. 05/17/17   Kassie Mallick, MD  Insulin  Pen Needle 32G X 4 MM MISC Use 4x a day 07/06/22   Trixie File, MD  Lancets Misc. (ACCU-CHEK MULTICLIX LANCET DEV) KIT Used to check blood sugars four times daily. 05/17/17   Kassie Mallick, MD    Allergies: Patient has no known allergies.    Review of Systems  Constitutional:  Negative for chills and fatigue.  HENT: Negative.    Respiratory:  Negative for cough and shortness of breath.   Gastrointestinal:  Positive for abdominal pain and nausea. Negative for vomiting.  Genitourinary:  Negative for dysuria and frequency.  All other systems reviewed and are negative.   Updated Vital Signs BP (!) 156/106   Pulse 99   Temp 98.7 F (37.1 C)   Resp 14   SpO2 100%   Physical Exam Vitals and nursing note reviewed.  Constitutional:      General: He  is not in acute distress.    Appearance: Normal appearance. He is well-developed. He is not diaphoretic.  HENT:     Head: Normocephalic and atraumatic.     Mouth/Throat:     Comments: ketones noted on breath Eyes:     General:        Right eye: No discharge.        Left eye: No discharge.     Pupils: Pupils are equal, round, and reactive to light.  Cardiovascular:     Rate and Rhythm: Normal rate and regular rhythm.     Pulses: Normal pulses.     Heart sounds: Normal heart sounds.  Pulmonary:     Effort: Pulmonary effort is normal. No respiratory distress.     Breath  sounds: Normal breath sounds. No wheezing or rales.     Comments: Respirations equal and unlabored, patient able to speak in full sentences, lungs clear to auscultation bilaterally  Abdominal:     General: Bowel sounds are normal. There is no distension.     Palpations: Abdomen is soft. There is no mass.     Tenderness: There is no abdominal tenderness. There is no guarding.     Comments: Abdomen soft, nondistended, nontender to palpation in all quadrants without guarding or peritoneal signs  Musculoskeletal:        General: No deformity.     Cervical back: Neck supple.  Skin:    General: Skin is warm and dry.     Capillary Refill: Capillary refill takes less than 2 seconds.  Neurological:     Mental Status: He is alert and oriented to person, place, and time.     Coordination: Coordination normal.     Comments: Speech is clear, able to follow commands Moves extremities without ataxia, coordination intact  Psychiatric:        Mood and Affect: Mood normal.        Behavior: Behavior normal.     (all labs ordered are listed, but only abnormal results are displayed) Labs Reviewed  CBC - Abnormal; Notable for the following components:      Result Value   RDW 11.4 (*)    All other components within normal limits  URINALYSIS, ROUTINE W REFLEX MICROSCOPIC - Abnormal; Notable for the following components:   Color, Urine STRAW (*)    Glucose, UA >=500 (*)    Ketones, ur 80 (*)    All other components within normal limits  COMPREHENSIVE METABOLIC PANEL WITH GFR - Abnormal; Notable for the following components:   Sodium 133 (*)    CO2 14 (*)    Glucose, Bld 266 (*)    Total Bilirubin 2.0 (*)    Anion gap 19 (*)    All other components within normal limits  BETA-HYDROXYBUTYRIC ACID - Abnormal; Notable for the following components:   Beta-Hydroxybutyric Acid 4.45 (*)    All other components within normal limits  CBG MONITORING, ED - Abnormal; Notable for the following components:    Glucose-Capillary 385 (*)    All other components within normal limits  I-STAT CHEM 8, ED - Abnormal; Notable for the following components:   Sodium 134 (*)    Creatinine, Ser 0.60 (*)    Glucose, Bld 440 (*)    TCO2 14 (*)    All other components within normal limits  I-STAT VENOUS BLOOD GAS, ED - Abnormal; Notable for the following components:   pCO2, Ven 29.3 (*)    pO2, Ven 66 (*)  Bicarbonate 15.6 (*)    TCO2 16 (*)    Acid-base deficit 9.0 (*)    Sodium 134 (*)    All other components within normal limits  CBG MONITORING, ED - Abnormal; Notable for the following components:   Glucose-Capillary 205 (*)    All other components within normal limits  CBG MONITORING, ED - Abnormal; Notable for the following components:   Glucose-Capillary 233 (*)    All other components within normal limits  CBG MONITORING, ED - Abnormal; Notable for the following components:   Glucose-Capillary 162 (*)    All other components within normal limits  BETA-HYDROXYBUTYRIC ACID  BASIC METABOLIC PANEL WITH GFR    EKG: None  Radiology: No results found.   .Critical Care  Performed by: Alva Larraine FALCON, PA-C Authorized by: Alva Larraine FALCON, PA-C   Critical care provider statement:    Critical care time (minutes):  42   Critical care time was exclusive of:  Separately billable procedures and treating other patients and teaching time   Critical care was necessary to treat or prevent imminent or life-threatening deterioration of the following conditions:  Endocrine crisis   Critical care was time spent personally by me on the following activities:  Development of treatment plan with patient or surrogate, evaluation of patient's response to treatment, examination of patient, obtaining history from patient or surrogate, ordering and performing treatments and interventions, ordering and review of laboratory studies, re-evaluation of patient's condition and review of old charts    Medications  Ordered in the ED  insulin  regular, human (MYXREDLIN ) 100 units/ 100 mL infusion (4.6 Units/hr Intravenous New Bag/Given 10/10/23 1051)  lactated ringers  infusion ( Intravenous Not Given 10/10/23 1052)  dextrose  5 % in lactated ringers  infusion ( Intravenous New Bag/Given 10/10/23 1050)  dextrose  50 % solution 0-50 mL (has no administration in time range)  potassium chloride  10 mEq in 100 mL IVPB (10 mEq Intravenous New Bag/Given 10/10/23 1219)  lactated ringers  bolus 2,000 mL (0 mLs Intravenous Stopped 10/10/23 1149)                                    Medical Decision Making Amount and/or Complexity of Data Reviewed Labs: ordered.  Risk Prescription drug management.   Patient presents with elevated blood sugars and abdominal pain starting yesterday.  Noted to have a blood sugar of 440 on arrival.  Patient with concern for possible DKA in setting of type 1 diabetes.  Patient with an anion gap of 19, pH within normal range at 7.33 but bicarb of 14.  Beta hydroxybutyric acid elevated at 4.45.  Patient given 2 L fluid bolus and initiated on insulin  drip with Endo tool.  Diabetes educator at bedside with patient, discussed with patient that he is in DKA and recommended admission.  Patient expresses concerns saying that he cannot miss more work.  Discussed the risks of leaving the hospital while still in DKA including, coma, death. Patient is alert and oriented and has full decision making capacity but at this time is unsure about admission.  Would like to think about it while he continues treatment here in the emergency department.   Will repeat BMP and beta hydroxybutyric acid at 1 PM.  Repeat labs reassuring, anion gap has closed BHB remains elevated at 2.12 but is trending in the right direction.  Again discussed potential admission versus leaving with patient, explained that while his labs  are improving it is still recommended that he come in to the hospital overnight to ensure that BHB  returns to a normal level and that his glucose levels remain stable on his subcutaneous insulin .  At this time patient declines admission reports that he has to be able to leave tonight to go into work.  He will remain in the ED until 5 PM on insulin  drip then will have blood sugar checked and will leave AGAINST MEDICAL ADVICE.  I have discussed return precautions, outpatient management with continued insulin  and blood sugar monitoring and close PCP follow-up with patient and he expresses understanding.     Final diagnoses:  Diabetic ketoacidosis without coma associated with type 1 diabetes mellitus [facility])    ED Discharge Orders     None          Alva Larraine JULIANNA DEVONNA 10/10/23 1625    Ula Prentice SAUNDERS, MD 10/11/23 [phone]

## 2023-10-11 ENCOUNTER — Emergency Department (HOSPITAL_COMMUNITY)

## 2023-10-11 ENCOUNTER — Observation Stay (HOSPITAL_COMMUNITY)
Admission: EM | Admit: 2023-10-11 | Discharge: 2023-10-12 | Disposition: A | Discharge status: Home / Self Care | Attending: Infectious Diseases | Admitting: Infectious Diseases

## 2023-10-11 ENCOUNTER — Encounter (HOSPITAL_COMMUNITY): Payer: Self-pay

## 2023-10-11 ENCOUNTER — Other Ambulatory Visit: Payer: Self-pay

## 2023-10-11 DIAGNOSIS — E109 Type 1 diabetes mellitus without complications: Secondary | ICD-10-CM

## 2023-10-11 DIAGNOSIS — E1042 Type 1 diabetes mellitus with diabetic polyneuropathy: Secondary | ICD-10-CM | POA: Diagnosis not present

## 2023-10-11 DIAGNOSIS — Z79899 Other long term (current) drug therapy: Secondary | ICD-10-CM | POA: Diagnosis not present

## 2023-10-11 DIAGNOSIS — R531 Weakness: Secondary | ICD-10-CM | POA: Diagnosis present

## 2023-10-11 DIAGNOSIS — R1013 Epigastric pain: Secondary | ICD-10-CM | POA: Diagnosis present

## 2023-10-11 DIAGNOSIS — I10 Essential (primary) hypertension: Secondary | ICD-10-CM | POA: Diagnosis present

## 2023-10-11 DIAGNOSIS — Z794 Long term (current) use of insulin: Secondary | ICD-10-CM | POA: Insufficient documentation

## 2023-10-11 DIAGNOSIS — E1010 Type 1 diabetes mellitus with ketoacidosis without coma: Principal | ICD-10-CM | POA: Diagnosis present

## 2023-10-11 LAB — CBG MONITORING, ED
Glucose-Capillary: 183 mg/dL — ABNORMAL HIGH (ref 70–99)
Glucose-Capillary: 270 mg/dL — ABNORMAL HIGH (ref 70–99)
Glucose-Capillary: 207 mg/dL — ABNORMAL HIGH (ref 70–99)
Glucose-Capillary: 160 mg/dL — ABNORMAL HIGH (ref 70–99)
Glucose-Capillary: 147 mg/dL — ABNORMAL HIGH (ref 70–99)
Glucose-Capillary: 112 mg/dL — ABNORMAL HIGH (ref 70–99)
Glucose-Capillary: 133 mg/dL — ABNORMAL HIGH (ref 70–99)

## 2023-10-11 LAB — HEPATIC FUNCTION PANEL
ALT: 25 U/L (ref 0–44)
AST: 26 U/L (ref 15–41)
Albumin: 4.1 g/dL (ref 3.5–5.0)
Alkaline Phosphatase: 52 U/L (ref 38–126)
Bilirubin, Direct: 0.3 mg/dL — ABNORMAL HIGH (ref 0.0–0.2)
Indirect Bilirubin: 1.9 mg/dL — ABNORMAL HIGH (ref 0.3–0.9)
Total Bilirubin: 2.2 mg/dL — ABNORMAL HIGH (ref 0.0–1.2)
Total Protein: 6.9 g/dL (ref 6.5–8.1)

## 2023-10-11 LAB — CBC
HCT: 45.4 % (ref 39.0–52.0)
Hemoglobin: 15.1 g/dL (ref 13.0–17.0)
MCH: 30.8 pg (ref 26.0–34.0)
MCHC: 33.3 g/dL (ref 30.0–36.0)
MCV: 92.7 fL (ref 80.0–100.0)
Platelets: 335 K/uL (ref 150–400)
RBC: 4.9 MIL/uL (ref 4.22–5.81)
RDW: 11.3 % — ABNORMAL LOW (ref 11.5–15.5)
WBC: 9.5 K/uL (ref 4.0–10.5)
nRBC: 0 % (ref 0.0–0.2)

## 2023-10-11 LAB — I-STAT CHEM 8, ED
BUN: 9 mg/dL (ref 6–20)
Calcium, Ion: 1.22 mmol/L (ref 1.15–1.40)
Chloride: 107 mmol/L (ref 98–111)
Creatinine, Ser: 0.6 mg/dL — ABNORMAL LOW (ref 0.61–1.24)
Glucose, Bld: 185 mg/dL — ABNORMAL HIGH (ref 70–99)
HCT: 48 % (ref 39.0–52.0)
Hemoglobin: 16.3 g/dL (ref 13.0–17.0)
Potassium: 3.8 mmol/L (ref 3.5–5.1)
Sodium: 135 mmol/L (ref 135–145)
TCO2: 12 mmol/L — ABNORMAL LOW (ref 22–32)

## 2023-10-11 LAB — BASIC METABOLIC PANEL WITH GFR
Anion gap: 23 — ABNORMAL HIGH (ref 5–15)
Anion gap: 14 (ref 5–15)
Anion gap: 14 (ref 5–15)
BUN: 7 mg/dL (ref 6–20)
BUN: 8 mg/dL (ref 6–20)
BUN: 7 mg/dL (ref 6–20)
CO2: 10 mmol/L — ABNORMAL LOW (ref 22–32)
CO2: 14 mmol/L — ABNORMAL LOW (ref 22–32)
CO2: 14 mmol/L — ABNORMAL LOW (ref 22–32)
Calcium: 9.5 mg/dL (ref 8.9–10.3)
Calcium: 8.7 mg/dL — ABNORMAL LOW (ref 8.9–10.3)
Calcium: 8.6 mg/dL — ABNORMAL LOW (ref 8.9–10.3)
Chloride: 102 mmol/L (ref 98–111)
Chloride: 105 mmol/L (ref 98–111)
Chloride: 102 mmol/L (ref 98–111)
Creatinine, Ser: 1.13 mg/dL (ref 0.61–1.24)
Creatinine, Ser: 0.92 mg/dL (ref 0.61–1.24)
Creatinine, Ser: 1.02 mg/dL (ref 0.61–1.24)
GFR, Estimated: >60 mL/min (ref >60)
GFR, Estimated: >60 mL/min (ref >60)
GFR, Estimated: >60 mL/min (ref >60)
Glucose, Bld: 177 mg/dL — ABNORMAL HIGH (ref 70–99)
Glucose, Bld: 141 mg/dL — ABNORMAL HIGH (ref 70–99)
Glucose, Bld: 187 mg/dL — ABNORMAL HIGH (ref 70–99)
Potassium: 3.8 mmol/L (ref 3.5–5.1)
Potassium: 3.9 mmol/L (ref 3.5–5.1)
Potassium: 4 mmol/L (ref 3.5–5.1)
Sodium: 135 mmol/L (ref 135–145)
Sodium: 133 mmol/L — ABNORMAL LOW (ref 135–145)
Sodium: 130 mmol/L — ABNORMAL LOW (ref 135–145)

## 2023-10-11 LAB — GLUCOSE, CAPILLARY: Glucose-Capillary: 194 mg/dL — ABNORMAL HIGH (ref 70–99)

## 2023-10-11 LAB — HIV ANTIBODY (ROUTINE TESTING W REFLEX): HIV Screen 4th Generation wRfx: NONREACTIVE

## 2023-10-11 LAB — I-STAT VENOUS BLOOD GAS, ED
Acid-base deficit: 15 mmol/L — ABNORMAL HIGH (ref 0.0–2.0)
Bicarbonate: 9.3 mmol/L — ABNORMAL LOW (ref 20.0–28.0)
Calcium, Ion: 1.21 mmol/L (ref 1.15–1.40)
HCT: 44 % (ref 39.0–52.0)
Hemoglobin: 15 g/dL (ref 13.0–17.0)
O2 Saturation: 97 %
Potassium: 4.4 mmol/L (ref 3.5–5.1)
Sodium: 131 mmol/L — ABNORMAL LOW (ref 135–145)
TCO2: 10 mmol/L — ABNORMAL LOW (ref 22–32)
pCO2, Ven: 20.9 mmHg — ABNORMAL LOW (ref 44–60)
pH, Ven: 7.259 (ref 7.25–7.43)
pO2, Ven: 103 mmHg — ABNORMAL HIGH (ref 32–45)

## 2023-10-11 LAB — BETA-HYDROXYBUTYRIC ACID: Beta-Hydroxybutyric Acid: >8 mmol/L — ABNORMAL HIGH (ref 0.05–0.27)

## 2023-10-11 LAB — MAGNESIUM: Magnesium: 2.2 mg/dL (ref 1.7–2.4)

## 2023-10-11 LAB — LIPASE, BLOOD: Lipase: 25 U/L (ref 11–51)

## 2023-10-11 MED ORDER — LACTATED RINGERS IV BOLUS
20.0000 mL/kg | Freq: Once | INTRAVENOUS | Status: AC
  Administered 2023-10-11: 1300 mL via INTRAVENOUS

## 2023-10-11 MED ORDER — POTASSIUM CHLORIDE 10 MEQ/100ML IV SOLN
10.0000 meq | INTRAVENOUS | Status: AC
  Administered 2023-10-11: 10 meq via INTRAVENOUS
  Filled 2023-10-11: qty 100

## 2023-10-11 MED ORDER — INSULIN ASPART 100 UNIT/ML IJ SOLN: 10.0000 [IU] | Freq: Three times a day (TID) | INTRAMUSCULAR | Status: DC

## 2023-10-11 MED ORDER — LACTATED RINGERS IV SOLN: INTRAVENOUS | Status: AC

## 2023-10-11 MED ORDER — LACTATED RINGERS IV SOLN: INTRAVENOUS | Status: DC

## 2023-10-11 MED ORDER — INSULIN ASPART 100 UNIT/ML IJ SOLN
0.0000 [IU] | Freq: Three times a day (TID) | INTRAMUSCULAR | Status: DC
  Administered 2023-10-11: 2 [IU] via SUBCUTANEOUS
  Administered 2023-10-12: 3 [IU] via SUBCUTANEOUS

## 2023-10-11 MED ORDER — POTASSIUM CHLORIDE 2 MEQ/ML IV SOLN
INTRAVENOUS | Status: AC
  Filled 2023-10-11: qty 1000

## 2023-10-11 MED ORDER — LISINOPRIL 20 MG PO TABS
20.0000 mg | ORAL_TABLET | Freq: Every day | ORAL | Status: DC
  Administered 2023-10-11 – 2023-10-12 (×2): 20 mg via ORAL
  Filled 2023-10-11 (×2): qty 1

## 2023-10-11 MED ORDER — DEXTROSE 50 % IV SOLN: 0.0000 mL | INTRAVENOUS | Status: DC | PRN

## 2023-10-11 MED ORDER — POTASSIUM CHLORIDE 10 MEQ/100ML IV SOLN
10.0000 meq | Freq: Once | INTRAVENOUS | Status: DC
  Filled 2023-10-11: qty 100

## 2023-10-11 MED ORDER — ENOXAPARIN SODIUM 40 MG/0.4ML IJ SOSY
40.0000 mg | PREFILLED_SYRINGE | INTRAMUSCULAR | Status: DC
  Administered 2023-10-11: 40 mg via SUBCUTANEOUS
  Filled 2023-10-11: qty 0.4

## 2023-10-11 MED ORDER — INSULIN REGULAR(HUMAN) IN NACL 100-0.9 UT/100ML-% IV SOLN
INTRAVENOUS | Status: DC
  Administered 2023-10-11: 8 [IU]/h via INTRAVENOUS
  Filled 2023-10-11: qty 100

## 2023-10-11 MED ORDER — GABAPENTIN 300 MG PO CAPS
300.0000 mg | ORAL_CAPSULE | Freq: Three times a day (TID) | ORAL | Status: DC
  Administered 2023-10-11 – 2023-10-12 (×3): 300 mg via ORAL
  Filled 2023-10-11 (×3): qty 1

## 2023-10-11 MED ORDER — INSULIN GLARGINE 100 UNIT/ML ~~LOC~~ SOLN
40.0000 [IU] | Freq: Every day | SUBCUTANEOUS | Status: DC
  Administered 2023-10-11 – 2023-10-12 (×2): 40 [IU] via SUBCUTANEOUS
  Filled 2023-10-11 (×2): qty 0.4

## 2023-10-11 MED ORDER — DEXTROSE IN LACTATED RINGERS 5 % IV SOLN: INTRAVENOUS | Status: DC

## 2023-10-11 NOTE — Inpatient Diabetes Management (Addendum)
 Inpatient Diabetes Program Recommendations  AACE/ADA: New Consensus Statement on Inpatient Glycemic Control (2015)  Target Ranges:  Prepandial:   less than 140 mg/dL      Peak postprandial:   less than 180 mg/dL (1-2 hours)      Critically ill patients:  140 - 180 mg/dL    Latest Reference Range & Units 09/25/23 15:47  HbA1c, POC (controlled diabetic range) 0.0 - 7.0 % 10.5 !  !: Data is abnormal    Admit with:  Complaint of generalized weakness, palpitations, abdominal pain, muscle aches Patient reports he was here yesterday and left AGAINST MEDICAL ADVICE he was found to be in diabetic ketoacidosis.  He now feels worse than he did yesterday and reports he can't keep food down due to nausea.  He does note that he was recently in [place] for vacation, does not note infectious symptoms, has been using insulin , and no elevated CBG ate home.   History: Type 1 diabetes  Home DM Meds: Dexcom G7 CGM       Tresiba  30 units daily       Humalog  up to 60 units dail  Current Orders: IV Insulin  Drip (starting down in the ED)   Met w/ pt down in the ED.  Pt A&O and able to have meaningful conversation.  States he has Insulin  at home and has been using it.  Unknown cause of DKA?  Told me he tried to call [facility] for an appt and they never called him back.  Instead, he stated someone made him an appt with his PCP at [facility] internal medicine.  Saw Dr. Elicia on 09/25/2023 and told me they asked him to make follow up with ENDO.  He still needs to make this appt with ENDO and plans to after d/c.  Told me he is taking Tresiba  40 units daily + Humalog  (has been estimating doses and not using his CF and Carb Ratio as was prescribed by the ENDO last year in July).  Pt asked me to review these with him which I did.  I asked pt if he had any questions about counting carbs or correcting CBGs and he stated he did not and that he would begin to use the Carb Ratio and CF when he goes home.  These values were  also placed in his AVS for review.  Has been using the Dexcom G7 CGM with his phone app--Took his sensor off a few days ago but plans to replace the sensor when he goes home--Has more sensors at home per pt report.  Also discussed with patient diagnosis of DKA (pathophysiology), treatment of DKA, lab results, and transition plan to SQ insulin  regimen.  Pt very appreciative of visit and did not have further questions for me at this time.       PCP: [facility] ENDO: Dr. Trixie with [facility] Last Seen 07/06/2022 Please look into: Omnipod 5/ Tandem t:slim x2/ Tandem mobi Please schedule an appt with Rock Dasen for diabetes education [phone] Restart the CGM ASAP Please increase: Tresiba  34 units daily Continue Humalog   ICR 1:4 Target 130 ISF:  20; max 8-10 correction Please return in 3 months    --Will follow patient during hospitalization--  Adina Rudolpho Arrow RN, MSN, CDCES Diabetes Coordinator Inpatient Glycemic Control Team Team Pager: [phone] (8a-5p)

## 2023-10-11 NOTE — Hospital Course (Addendum)
 DKA Type 1 DM Home regimen per patient includes Tresiba  40 units daily and Humalog  20 units 3 times daily with meals.  He confirms adherence.  A1c 11.6. Presented to the ED after feeling weak and unable to eat since Wednesday.  Open experiencing a burning sensation and burping that radiated down his chest.  He was in the emergency room 10/10/2023 due to stomach pain and dehydration.  Admission for DKA was recommended but he declined.  He presented back 10/11/2023 after his condition worsened.  In the ED his workup was notable for bicarbonate of 10 with anion gap of 29 and elevated beta-hydroxybutyrate. Potassium was 3.8. IV fluids were started with supplemental potassium. Insulin  was started.  Uncertain trigger.  No recent infections.  Lipase negative.  Hepatic function panel with bilirubin 2.2.  HIV nonreactive.  Patient improved with IVF and IV insulin .  He is being discharged on 40 units long-acting daily and 10 units short acting 3 times daily with meals.  Encouraged follow-up with endocrinology and PCP.

## 2023-10-11 NOTE — ED Triage Notes (Addendum)
 Pt evaluated yesterday for hyperglycemia, left AMA; today pt c/o palpitations, sob, generalized weakness since last night; pt denies pain

## 2023-10-11 NOTE — Discharge Instructions (Addendum)
 Please use your Correction Factor and Carbohydrate Ration as prescribed by your ENDO until your next appt with ENDO: Humalog  1 unit for every 4 grams Carbohydrates Humalog  1 unit for every 20 mg/dl above Target CBG of 869 Target CBG 130 mg/dl Call [facility] for appt  Thank you for allowing us  to be part of your care. You were hospitalized for DKA. We treated you with insulin  and fluids while monitoring your electrolytes.  Diabetic ketoacidosis is a process when the body does not have enough insulin .  Insulin  is a hormone that helps sugar move from the blood into the cells for energy.  Without insulin , the body starts to break down fat for energy causing the byproducts to build up in the blood and make you feel and be very sick.  See the changes in your medications and management of your chronic conditions below:  *For your diabetes We have STARTED you on these following medications: - Long-acting insulin  40 units daily (tresiba ) - Short acting insulin  (humalog ) 10 units 3 times daily with meals  Always check the label before injecting.  Store your insulin  in the refrigerator unless you are carrying it with you.  Check your blood sugar least once every morning before meals.  Write down your results and bring them to your follow-up appointments. We have also provided you with the following supplies: - Pen needles - Glucose test trips - Lancet  Understand signs of low blood sugar: - Early symptoms: Shakiness, irritability, confusion, high heart rate, sweating, hunger. - Late symptoms: Confusion, dizziness, blurred vision, difficulty concentrating, behavioral changes, loss of consciousness - If you feel symptoms, check your blood sugar and drink or eat fast acting sugar like 4 ounces of juice, regular soda, or candy.  Recheck your blood sugar after 15 minutes and if it still low, repeat the treatment.  Always carry a source of sugar with you.  For emergencies or severe low or high  blood sugar, trouble breathing, confusion, unconsciousness call 911  Other information to help you: - If you are sick, continue taking insulin  unless told otherwise.  Check your blood sugars more often.  Drink plenty of fluids. - Eat healthy with vegetables, whole grains, lean proteins, limit sugary foods.  -Physical activity can help control your blood sugar.  FOLLOW UP APPOINTMENTS: Please see your primary care doctor in 7 to 10 days I have sent in a request for an appointment with the [facility], if you do not hear back by early next week, please call. Please make an appointment with your endocrinologist.   Please call your PCP if you have any questions or concerns, they may be able to help and keep you from a long and expensive emergency room wait.   We are glad you are feeling better,  Sallyanne Primas Internal Medicine Inpatient Teaching Service at [facility]

## 2023-10-11 NOTE — H&P (Signed)
 Date: 10/11/2023         Patient Name:  Glen Merritt MRN: [id number]  DOB: June 22, 2000 Age / Sex: 23 y.o., male   PCP: Elicia Sharper, DO         Medical Service: Internal Medicine Teaching Service         Attending Physician: Dr. Trudy, Mliss Dragon, MD    First Contact: Dr. Benuel Pager: [phone]  Second Contact: Dr. Heddy Pager: [phone]                 Chief Concern: Palpitations, DKA  History of Present Illness: 23 year old drove himself to ED this morning for DKA.  He has been feeling weak and unable to eat since Wednesday, experiencing a burning sensation when burping that radiates down his chest and leaves his mouth full of saliva. The chest pain is described as a burning sensation that occurs primarily when burping.  He was in the emergency room yesterday due to stomach pain and dehydration, where he received insulin  and felt better upon discharge. Admission for DKA was recommended but he declined. However, his condition worsened at home, leading to his return today.  He has a history of type 1 diabetes for 20 years, diagnosed at age 56. He has experienced DKA episodes before, with the last occurrence being last year during a period of personal stress, and prior to that in 2015.  He takes Tresiba  40 units daily and Humalog  60 units daily, adjusting the Humalog  dose based on food intake. He confirms adherence to his insulin  regimen recently.  He recently traveled to [place], where he drank less water than usual due to a preference for spring water, which was unavailable. He suspects dehydration may have contributed to his current condition.  No recent illness, fever, or infection. He reports feeling great before the trip and noticed decreased energy levels upon returning to work on Wednesday.  He takes lisinopril  for high blood pressure and denies any surgeries.  He works in the Community education officer for [employer] and enjoys weightlifting and gym activities in  his free time. He vapes nicotine daily and drinks alcohol infrequently, with no significant consumption during his recent trip. He denies other drug use.  In the ED his workup was notable for bicarbonate of 10 with anion gap of 29 and elevated beta-hydroxybutyrate. Potassium was 3.8. IV fluids were started with supplemental potassium. Insulin  was started. Our team was asked to evaluate for admission.  ROS no fever or recent illness.   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  On a pump in the past    Medications: No current facility-administered medications on file prior to encounter.   Current Outpatient Medications on File Prior to Encounter  Medication Sig Dispense Refill   gabapentin  (NEURONTIN ) 300 MG capsule Take 1 capsule (300 mg total) by mouth in the morning and afternoon. Take 2 capsule (600  mg total) by mouth at bedtime. (Patient taking differently: Take 300 mg by mouth 3 (three) times daily.) 120 capsule 5   insulin  degludec (TRESIBA  FLEXTOUCH) 200 UNIT/ML FlexTouch Pen Inject 30 Units into the skin daily. (Patient taking differently: Inject 40 Units into the skin daily.) 48 mL 3   insulin  lispro (HUMALOG  KWIKPEN) 100 UNIT/ML KwikPen USE AS DIRECTED, INJECT A TOTAL OF 40 UNITS DAILY (Patient taking differently: Inject 20 Units into the skin 3 (three) times daily.) 30 mL  1   lisinopril  (ZESTRIL ) 20 MG tablet Take 1 tablet (20 mg total) by mouth daily. 30 tablet 11   Continuous Glucose Sensor (DEXCOM G7 SENSOR) MISC Use continuously to measure glucose levels. 6 each 3   glucose blood (ACCU-CHEK GUIDE) test strip Used to check blood sugars four times daily. 200 each 11   Insulin  Pen Needle 32G X 4 MM MISC Use 4x a day 400 each 3   Lancets Misc. (ACCU-CHEK MULTICLIX LANCET DEV) KIT Used to check blood sugars four times daily. 200 each 11     Surgical History: History reviewed. No pertinent surgical history.  Family History:  Family History  Problem Relation Age of Onset   Heart disease Maternal Grandmother    Heart disease Maternal Grandfather    Cancer Maternal Grandfather    Obesity Mother    Diabetes Mother    Hypertension Mother     Social History:  Social History   Socioeconomic History   Marital status: Single    Spouse name: Not on file   Number of children: Not on file   Years of education: Not on file   Highest education level: Not on file  Occupational History   Not on file  Tobacco Use   Smoking status: Never   Smokeless tobacco: Never  Vaping Use   Vaping status: Every Day  Substance and Sexual Activity   Alcohol use: Yes    Comment: occ   Drug use: Never   Sexual activity: Not on file  Other Topics Concern   Not on file  Social History Narrative   Not on file   Social Drivers of Health   Financial Resource Strain: Not on file  Food Insecurity: No Food Insecurity (01/23/2023)   Hunger Vital Sign    Worried About Running Out of Food in the Last Year: Never true    Ran Out of Food in the Last Year: Never true  Transportation Needs: No Transportation Needs (01/23/2023)   PRAPARE - Administrator, Civil Service (Medical): No    Lack of Transportation (Non-Medical): No  Physical Activity: Not on file  Stress: Not on file  Social Connections: Not on file  Intimate Partner Violence: Not At Risk (01/23/2023)    Humiliation, Afraid, Rape, and Kick questionnaire    Fear of Current or Ex-Partner: No    Emotionally Abused: No    Physically Abused: No    Sexually Abused: No      Physical Exam: Blood pressure (!) 158/108, pulse (!) 105, temperature 97.8 F (36.6 C), resp. rate 18, height 5' 9 (1.753 m), weight 68 kg, SpO2 100%.  No distress Moist pink oral mucosa, no pharyngeal erythema or exudates Heart rate is fast, regular rhythm, no murmurs, strong radial pulses Breathing comfortably, anterior lung fields clear Skin warm and dry Non-tender abdomen Alert and oriented, speech is normal, no facial asymmetry  EKG:  Sinus tachycardia 132 bpm with TWI inferolateral leads, not dissimilar to prior EKGs  Labs:  Per above.  Assessment & Plan:  Glen Merritt is a 23 y.o. with type 1 diabetes and recurrent DKA who presents with epigastric discomfort, palpitations, and is admitted for DKA.  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 With metabolic acidosis, elevated anion gap, elevated beta-hydroxybutyrate. Uncertain trigger here. No recent infections. Describes good adherence to insulin . He's having some epigastric discomfort so I'll check lipase to rule out pancreatitis. Poor glycemic control overall, A1c 11.6, hasn't seen endocrinology in at least a year. Continue IV fluids, potassium, and insulin .  -insulin  per endotool until anion gap closure -D5 in LR w/ 40 meq kcl infusion at 125 ml/h -lantus  40 u subcutaneously now -start mealtime insulin  and SSI when he resumes eating -bmp q6 h  Active Problems:   Peripheral neuropathy associated with diabetes Chronic, stable. Resume outpatient gabapentin .    Essential hypertension Chronic, poor control. Kidney function okay today, resume outpatient lisinopril .    Abdominal pain, epigastric Suspect this is due to DKA, but will check lipase for pancreatitis.  Level of care:  progressive Diet: npo IVF: D5 in LR + KCl VTE: enoxaparin  (LOVENOX ) injection 40 mg Start: 10/11/23 1230 Code: full Surrogate: mother  Signed: Ozell Kung MD 10/11/2023, 2:23 PM Pager: [phone]

## 2023-10-11 NOTE — ED Provider Notes (Signed)
 [facility] EMERGENCY DEPARTMENT AT [facility] Provider Note   CSN: [id number] Arrival date & time: 10/11/23  9187     Patient presents with: No chief complaint on file.   Glen Merritt is a 23 y.o. male with past medical history of type 1 diabetes presenting to emergency room today with complaint of generalized weakness, palpitations, abdominal pain, muscle aches.  Patient reports he was here yesterday and left AGAINST MEDICAL ADVICE he was found to be in diabetic ketoacidosis. He now feels worse than he did yesterday and reports he can't keep food down due to nausea. He does note that he was recently in [place] for vacation, does not note infectious symptoms, has been using insulin , and no elevated CBG ate home.    HPI     Prior to Admission medications   Medication Sig Start Date End Date Taking? Authorizing Provider  Blood Glucose Monitoring Suppl (ACCU-CHEK GUIDE) w/Device KIT 1 Device by Does not apply route 4 (four) times daily. 07/11/18   Kassie Mallick, MD  Continuous Glucose Sensor (DEXCOM G7 SENSOR) MISC Use continuously to measure glucose levels. 07/06/22   Trixie File, MD  gabapentin  (NEURONTIN ) 300 MG capsule Take 1 capsule (300 mg total) by mouth in the morning and afternoon. Take 2 capsule (600 mg total) by mouth at bedtime. 09/25/23   Zheng, Michael, DO  glucose blood (ACCU-CHEK GUIDE) test strip Used to check blood sugars four times daily. 05/17/17   Kassie Mallick, MD  insulin  degludec (TRESIBA  FLEXTOUCH) 200 UNIT/ML FlexTouch Pen Inject 30 Units into the skin daily. 04/26/22   Jinwala, Sagar H, MD  insulin  lispro (HUMALOG  KWIKPEN) 100 UNIT/ML KwikPen USE AS DIRECTED, INJECT A TOTAL OF 40 UNITS DAILY Patient taking differently: Inject 60 Units into the skin daily. USE AS DIRECTED, INJECT A TOTAL OF 40 UNITS DAILY 07/22/23   Trixie File, MD  Insulin  Pen Needle 32G X 4 MM MISC Use 4x a day 07/06/22   Trixie File, MD  Lancets Misc. (ACCU-CHEK  MULTICLIX LANCET DEV) KIT Used to check blood sugars four times daily. 05/17/17   Kassie Mallick, MD  lisinopril  (ZESTRIL ) 20 MG tablet Take 1 tablet (20 mg total) by mouth daily. 09/25/23 09/24/24  Zheng, Michael, DO    Allergies: Patient has no known allergies.    Review of Systems  Gastrointestinal:  Positive for abdominal pain.    Updated Vital Signs BP (!) 148/92 (BP Location: Right Arm)   Pulse (!) 125   Temp 97.7 F (36.5 C) (Oral)   Resp 18   SpO2 100%   Physical Exam Vitals and nursing note reviewed.  Constitutional:      General: He is not in acute distress.    Appearance: He is not toxic-appearing.  HENT:     Head: Normocephalic and atraumatic.  Eyes:     General: No scleral icterus.    Conjunctiva/sclera: Conjunctivae normal.  Cardiovascular:     Rate and Rhythm: Regular rhythm. Tachycardia present.     Pulses: Normal pulses.     Heart sounds: Normal heart sounds.  Pulmonary:     Effort: Pulmonary effort is normal. No respiratory distress.     Breath sounds: Normal breath sounds.  Abdominal:     General: Abdomen is flat. Bowel sounds are normal.     Palpations: Abdomen is soft.     Tenderness: There is no abdominal tenderness.  Musculoskeletal:     Right lower leg: No edema.     Left lower  leg: No edema.  Skin:    General: Skin is warm and dry.     Findings: No lesion.  Neurological:     General: No focal deficit present.     Mental Status: He is alert and oriented to person, place, and time. Mental status is at baseline.     (all labs ordered are listed, but only abnormal results are displayed) Labs Reviewed  BASIC METABOLIC PANEL WITH GFR - Abnormal; Notable for the following components:      Result Value   CO2 10 (*)    Glucose, Bld 177 (*)    Anion gap 23 (*)    All other components within normal limits  CBC - Abnormal; Notable for the following components:   RDW 11.3 (*)    All other components within normal limits  CBG MONITORING, ED -  Abnormal; Notable for the following components:   Glucose-Capillary 183 (*)    All other components within normal limits  I-STAT CHEM 8, ED - Abnormal; Notable for the following components:   Creatinine, Ser 0.60 (*)    Glucose, Bld 185 (*)    TCO2 12 (*)    All other components within normal limits  I-STAT VENOUS BLOOD GAS, ED - Abnormal; Notable for the following components:   pCO2, Ven 20.9 (*)    pO2, Ven 103 (*)    Bicarbonate 9.3 (*)    TCO2 10 (*)    Acid-base deficit 15.0 (*)    Sodium 131 (*)    All other components within normal limits  CBG MONITORING, ED - Abnormal; Notable for the following components:   Glucose-Capillary 270 (*)    All other components within normal limits  URINALYSIS, ROUTINE W REFLEX MICROSCOPIC  BETA-HYDROXYBUTYRIC ACID  BETA-HYDROXYBUTYRIC ACID  BETA-HYDROXYBUTYRIC ACID  BETA-HYDROXYBUTYRIC ACID  HEPATIC FUNCTION PANEL  MAGNESIUM    EKG: None  Radiology: DG Chest 2 View Result Date: 10/11/2023 EXAM: 2 VIEW(S) XRAY OF THE CHEST 10/11/2023 09:10:00 AM COMPARISON: Portable chest 04/17/2022 and earlier. CLINICAL HISTORY: 23 year old male. Palpitations, SOB. FINDINGS: LUNGS AND PLEURA: Lung markings are within normal limits. No focal pulmonary opacity. No pulmonary edema. No pleural effusion. No pneumothorax. HEART AND MEDIASTINUM: No acute abnormality of the cardiac and mediastinal silhouettes. BONES AND SOFT TISSUES: No acute osseous abnormality. IMPRESSION: 1. No cardiopulmonary abnormality identified. Electronically signed by: Helayne Hurst MD 10/11/2023 09:17 AM EDT RP Workstation: [id number]     .Critical Care  Performed by: Shermon Warren SAILOR, PA-C Authorized by: Shermon Warren SAILOR, PA-C   Critical care provider statement:    Critical care time (minutes):  45   Critical care was necessary to treat or prevent imminent or life-threatening deterioration of the following conditions:  Endocrine crisis   Critical care was time spent personally by  me on the following activities:  Development of treatment plan with patient or surrogate, discussions with consultants, evaluation of patient's response to treatment, examination of patient, ordering and review of laboratory studies, ordering and review of radiographic studies, ordering and performing treatments and interventions, pulse oximetry, re-evaluation of patient's condition and review of old charts   Care discussed with: admitting provider      Medications Ordered in the ED  lactated ringers  bolus 1,300 mL (has no administration in time range)  insulin  regular, human (MYXREDLIN ) 100 units/ 100 mL infusion (has no administration in time range)  lactated ringers  infusion (has no administration in time range)  dextrose  5 % in lactated ringers  infusion (has no  administration in time range)  dextrose  50 % solution 0-50 mL (has no administration in time range)  potassium chloride  10 mEq in 100 mL IVPB (has no administration in time range)                                    Medical Decision Making Amount and/or Complexity of Data Reviewed Labs: ordered. Radiology: ordered.  Risk Prescription drug management. Decision regarding hospitalization.   This patient presents to the ED for concern of generalized weakness, this involves an extensive number of treatment options, and is a complaint that carries with it a high risk of complications and morbidity.  The differential diagnosis includes electrolyte abnormality, dehydration, pulmonary embolism, ACS, DKA, hyperglycemia   Co morbidities that complicate the patient evaluation  DM1   Additional history obtained:  Additional history obtained from 10/10/23 found to have elevated anion gap, normal range of pH and Beta Hydroxybutyric acid 4.45 A1C around 10, last 2 wks ago 10.5   Lab Tests:  I personally interpreted labs.  The pertinent results include:   Labs show no leukocytosis. BMP is remarkable for a bicarb of 10, anion gap  of 23.  I did check VBG which showed normal pH, but likely comp met acidosis with bicarb of 9.   Imaging Studies ordered:  I ordered imaging studies including chest x-ray  I independently visualized and interpreted imaging which showed no acute findings.  I agree with the radiologist interpretation   Cardiac Monitoring: / EKG:  The patient was maintained on a cardiac monitor.  I personally viewed and interpreted the cardiac monitored which showed an underlying rhythm of: Sinus tachycardia with rate of 130   Consultations Obtained:  I requested consultation with the hospital team for admission,  and discussed lab and imaging findings as well as pertinent plan.   Problem List / ED Course / Critical interventions / Medication management  Patient returns today after being diagnosed with DKA yesterday and leaving AGAINST MEDICAL ADVICE.  He returns with generalized weakness, abdominal cramping nausea vomiting and feeling unwell.  He is hemodynamically stable although slightly tachycardic.  His lab work here is consistent with continued picture of DKA. I ordered medication including Insulin  Drip ordered due to DKA, given 1 round of potassium, given 2L fluid bolus.  Reevaluation of the patient after these medicines showed that the patient stayed the same I have reviewed the patients home medicines and have made adjustments as needed. Would benefit from admission for DKA.  Patient is agreeable to admission.        Final diagnoses:  Diabetic ketoacidosis without coma associated with type 1 diabetes mellitus [facility])    ED Discharge Orders     None          Shermon Warren SAILOR, PA-C 10/11/23 1058    Neysa Caron PARAS, DO 10/11/23 1511

## 2023-10-11 NOTE — ED Notes (Addendum)
 Norrine, MD notified of CBG 112. MD does not want insulin  drip stopped at this time.

## 2023-10-11 NOTE — ED Notes (Signed)
 Pt states that the D5/LR/kcl is burning some but tolerable so I haven't started the potassium run, MD aware. Per MD hold potassium.

## 2023-10-11 NOTE — ED Notes (Signed)
Pharmacy messaged for meds

## 2023-10-11 NOTE — ED Notes (Signed)
 Meal tray ordered for pt

## 2023-10-11 NOTE — ED Notes (Signed)
 A dinner tray has been ordered for the patient.

## 2023-10-12 ENCOUNTER — Other Ambulatory Visit (HOSPITAL_COMMUNITY): Payer: Self-pay

## 2023-10-12 DIAGNOSIS — E1010 Type 1 diabetes mellitus with ketoacidosis without coma: Secondary | ICD-10-CM | POA: Diagnosis not present

## 2023-10-12 DIAGNOSIS — Z79899 Other long term (current) drug therapy: Secondary | ICD-10-CM | POA: Diagnosis not present

## 2023-10-12 DIAGNOSIS — E1042 Type 1 diabetes mellitus with diabetic polyneuropathy: Secondary | ICD-10-CM

## 2023-10-12 DIAGNOSIS — I10 Essential (primary) hypertension: Secondary | ICD-10-CM

## 2023-10-12 LAB — BASIC METABOLIC PANEL WITH GFR
Anion gap: 14 (ref 5–15)
Anion gap: 15 (ref 5–15)
BUN: 6 mg/dL (ref 6–20)
BUN: 8 mg/dL (ref 6–20)
CO2: 17 mmol/L — ABNORMAL LOW (ref 22–32)
CO2: 20 mmol/L — ABNORMAL LOW (ref 22–32)
Calcium: 8.8 mg/dL — ABNORMAL LOW (ref 8.9–10.3)
Calcium: 9 mg/dL (ref 8.9–10.3)
Chloride: 98 mmol/L (ref 98–111)
Chloride: 98 mmol/L (ref 98–111)
Creatinine, Ser: 0.87 mg/dL (ref 0.61–1.24)
Creatinine, Ser: 1 mg/dL (ref 0.61–1.24)
GFR, Estimated: >60 mL/min (ref >60)
GFR, Estimated: >60 mL/min (ref >60)
Glucose, Bld: 192 mg/dL — ABNORMAL HIGH (ref 70–99)
Glucose, Bld: 207 mg/dL — ABNORMAL HIGH (ref 70–99)
Potassium: 3.4 mmol/L — ABNORMAL LOW (ref 3.5–5.1)
Potassium: 3.7 mmol/L (ref 3.5–5.1)
Sodium: 130 mmol/L — ABNORMAL LOW (ref 135–145)
Sodium: 132 mmol/L — ABNORMAL LOW (ref 135–145)

## 2023-10-12 LAB — GLUCOSE, CAPILLARY
Glucose-Capillary: 98 mg/dL (ref 70–99)
Glucose-Capillary: 155 mg/dL — ABNORMAL HIGH (ref 70–99)

## 2023-10-12 MED ORDER — INSULIN LISPRO (1 UNIT DIAL) 100 UNIT/ML (KWIKPEN)
10.0000 [IU] | PEN_INJECTOR | Freq: Three times a day (TID) | SUBCUTANEOUS | 0 refills | Status: DC
  Filled 2023-10-12: qty 9, 30d supply, fill #0

## 2023-10-12 MED ORDER — INSULIN ASPART 100 UNIT/ML IJ SOLN: 15.0000 [IU] | Freq: Three times a day (TID) | INTRAMUSCULAR | Status: DC

## 2023-10-12 MED ORDER — INSULIN ASPART 100 UNIT/ML IJ SOLN
10.0000 [IU] | Freq: Three times a day (TID) | INTRAMUSCULAR | Status: DC
  Administered 2023-10-12 (×2): 10 [IU] via SUBCUTANEOUS

## 2023-10-12 MED ORDER — TRESIBA FLEXTOUCH 100 UNIT/ML ~~LOC~~ SOPN
40.0000 [IU] | PEN_INJECTOR | Freq: Every day | SUBCUTANEOUS | 0 refills | Status: AC
  Filled 2023-10-12: qty 3, 7d supply, fill #0

## 2023-10-12 NOTE — Plan of Care (Signed)

## 2023-10-12 NOTE — Discharge Summary (Signed)
 Name: Glen Merritt MRN: [id number] DOB: [birth year] 23 y.o. PCP: Elicia Sharper, DO  Date of Admission: 10/11/2023  8:12 AM Date of Discharge: 10/12/2023 Attending Physician: Dr. Reyes Fenton  Discharge Diagnosis: 1. 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   Dexcom G7 Sensor Misc Use continuously to measure glucose levels.   gabapentin  300 MG capsule Commonly known as: Neurontin  Take 1 capsule (300 mg total) by mouth in the morning and afternoon. Take 2 capsule (600 mg total) by mouth at bedtime. What changed:  how much to take how to take this when to take this additional instructions   glucose blood test strip Commonly known as: Accu-Chek Guide Used to check blood sugars four times daily.   insulin  lispro 100 UNIT/ML KwikPen Commonly known as: HumaLOG  KwikPen Inject 10 Units into the skin 3 (three) times daily. USE AS DIRECTED, INJECT A TOTAL OF 40 UNITS DAILY What changed:  how much to take how to take this when to take this   Insulin  Pen Needle 32G X 4 MM Misc Use 4x a day   lisinopril  20 MG tablet Commonly known as: ZESTRIL  Take 1 tablet (20 mg total) by mouth daily.   Tresiba  FlexTouch 200 UNIT/ML FlexTouch Pen Generic drug: insulin  degludec Inject 40 Units into the skin daily.        Disposition and follow-up:   Glen Merritt was discharged from [facility] in Good condition.  At the hospital follow up visit please address:  1.  Type 1 DM/ DKA: New insulin  regimen (40 Units LA daily + 10 Units SA three times daily with  meals). Assess adherence   2.  Labs / imaging needed at time of follow-up: BMP  3.  Pending labs/ test needing follow-up: n/a  Follow-up Appointments:  Follow-up Information     Elicia Sharper, DO Follow up in 1 week(s).   Specialty: Internal Medicine Contact information: [address] [place] [address] [phone]         Trixie File, MD Follow up in 1 week(s).   Specialty: Internal Medicine Contact information: [address] [place] [address] [phone]                  Hospital Course by problem list: Glen Merritt is a 23 y.o. person living with a history of Type 1 DM, Diabetic neuropathy, HTN who presented with palpitations and admitted for DKA  now being discharged on hospital day 0 with the following pertinent hospital course:  DKA Type 1 DM Home regimen per patient includes Tresiba  40 units daily and Humalog  20 units 3 times daily with meals.  He confirms adherence.  A1c 11.6. Presented to the ED after feeling weak and unable to eat since Wednesday.  Open experiencing a burning sensation and burping that radiated down his chest.  He was in the emergency room 10/10/2023 due to stomach pain and dehydration.  Admission for DKA was recommended but he declined.  He presented back 10/11/2023 after his condition worsened.  In the ED his  workup was notable for bicarbonate of 10 with anion gap of 29 and elevated beta-hydroxybutyrate. Potassium was 3.8. IV fluids were started with supplemental potassium. Insulin  was started.  Uncertain trigger.  No recent infections.  Lipase negative.  Hepatic function panel with bilirubin 2.2.  HIV nonreactive.  Patient improved with IVF and IV insulin .  He is being discharged on 40 units long-acting daily and 10 units short acting 3 times daily with meals.  Encouraged follow-up with endocrinology and PCP.   Stable chronic medical conditions: Diabetic Peripheral Neuropathy on home  gabapentin  HTN on home lisinopril    Subjective Patient assessed bedside.  No complaints of nausea, vomiting, abdominal pain.  States he is eating well.  He is unsure what caused him to enter into DKA.  He states adherence to his home medications.  States that he feels well and is ready to go home.  Understands he requires follow-up with his endocrinologist and PCP  Discharge Exam:   BP (!) 139/99 (BP Location: Right Arm)   Pulse 99   Temp 98.6 F (37 C) (Oral)   Resp 16   Ht 5' 10 (1.778 m)   Wt 64.1 kg   SpO2 98%   BMI 20.27 kg/m  Discharge exam:  Physical Exam Constitutional:      General: He is not in acute distress.    Appearance: He is not ill-appearing, toxic-appearing or diaphoretic.  Eyes:     General: Gaze aligned appropriately.     Conjunctiva/sclera: Conjunctivae normal.  Cardiovascular:     Rate and Rhythm: Normal rate and regular rhythm.     Heart sounds: Normal heart sounds. No murmur heard.    No friction rub. No gallop.  Pulmonary:     Effort: Pulmonary effort is normal.     Breath sounds: Normal breath sounds. No decreased breath sounds, wheezing, rhonchi or rales.  Abdominal:     General: Bowel sounds are normal.     Palpations: Abdomen is soft.     Tenderness: There is no abdominal tenderness.  Musculoskeletal:        General: Normal range of motion.  Feet:     Right foot:     Skin integrity: Skin integrity normal.     Left foot:     Skin integrity: Skin integrity normal.  Skin:    General: Skin is warm and dry.  Neurological:     Mental Status: He is alert.      Pertinent Labs, Studies, and Procedures:     Latest Ref Rng & Units 10/11/2023   10:11 AM 10/11/2023    8:33 AM 10/11/2023    8:25 AM  CBC  WBC 4.0 - 10.5 K/uL   9.5   Hemoglobin 13.0 - 17.0 g/dL 84.9  83.6  84.8   Hematocrit 39.0 - 52.0 % 44.0  48.0  45.4   Platelets 150 - 400 K/uL   335        Latest Ref Rng & Units 10/12/2023    9:45 AM 10/12/2023   12:15 AM 10/11/2023     6:35 PM  CMP  Glucose 70 - 99 mg/dL 792  807  812   BUN 6 - 20 mg/dL 6  8  7    Creatinine 0.61 - 1.24 mg/dL 9.12  8.99  8.97   Sodium 135 - 145 mmol/L 132  130  130   Potassium 3.5 - 5.1 mmol/L 3.4  3.7  4.0   Chloride 98 - 111 mmol/L 98  98  102  CO2 22 - 32 mmol/L 20  17  14    Calcium 8.9 - 10.3 mg/dL 9.0  8.8  8.6     DG Chest 2 View Result Date: 10/11/2023 EXAM: 2 VIEW(S) XRAY OF THE CHEST 10/11/2023 09:10:00 AM COMPARISON: Portable chest 04/17/2022 and earlier. CLINICAL HISTORY: 23 year old male. Palpitations, SOB. FINDINGS: LUNGS AND PLEURA: Lung markings are within normal limits. No focal pulmonary opacity. No pulmonary edema. No pleural effusion. No pneumothorax. HEART AND MEDIASTINUM: No acute abnormality of the cardiac and mediastinal silhouettes. BONES AND SOFT TISSUES: No acute osseous abnormality. IMPRESSION: 1. No cardiopulmonary abnormality identified. Electronically signed by: Helayne Hurst MD 10/11/2023 09:17 AM EDT RP Workstation: [id number]     Discharge Instructions: Discharge Instructions     Call MD for:  difficulty breathing, headache or visual disturbances   Complete by: As directed    Call MD for:  extreme fatigue   Complete by: As directed    Call MD for:  hives   Complete by: As directed    Call MD for:  persistant dizziness or light-headedness   Complete by: As directed    Call MD for:  persistant nausea and vomiting   Complete by: As directed    Call MD for:  severe uncontrolled pain   Complete by: As directed    Call MD for:  temperature >100.4   Complete by: As directed    Diet - low sodium heart healthy   Complete by: As directed    Discharge instructions   Complete by: As directed    Thank you for allowing us  to be part of your care. You were hospitalized for DKA. We treated you with insulin  and fluids while monitoring your electrolytes.  Diabetic ketoacidosis is a process when the body does not have enough insulin .  Insulin  is a hormone  that helps sugar move from the blood into the cells for energy.  Without insulin , the body starts to break down fat for energy causing the byproducts to build up in the blood and make you feel and be very sick.  See the changes in your medications and management of your chronic conditions below:  *For your diabetes We have STARTED you on these following medications: - Long-acting insulin  40 units daily (tresiba ) - Short acting insulin  (humalog ) 10 units 3 times daily with meals  Always check the label before injecting.  Store your insulin  in the refrigerator unless you are carrying it with you.  Check your blood sugar least once every morning before meals.  Write down your results and bring them to your follow-up appointments. We have also provided you with the following supplies: - Pen needles - Glucose test trips - Lancet  Understand signs of low blood sugar: - Early symptoms: Shakiness, irritability, confusion, high heart rate, sweating, hunger. - Late symptoms: Confusion, dizziness, blurred vision, difficulty concentrating, behavioral changes, loss of consciousness - If you feel symptoms, check your blood sugar and drink or eat fast acting sugar like 4 ounces of juice, regular soda, or candy.  Recheck your blood sugar after 15 minutes and if it still low, repeat the treatment.  Always carry a source of sugar with you.  For emergencies or severe low or high blood sugar, trouble breathing, confusion, unconsciousness call 911  Other information to help you: - If you are sick, continue taking insulin  unless told otherwise.  Check your blood sugars more often.  Drink plenty of fluids. - Eat healthy with vegetables, whole grains, lean proteins,  limit sugary foods.  -Physical activity can help control your blood sugar.  FOLLOW UP APPOINTMENTS: Please see your primary care doctor in 7 to 10 days I have sent in a request for an appointment with the [facility] clinic, if you do not hear back by  early next week, please call. Please make an appointment with your endocrinologist.   Please call your PCP if you have any questions or concerns, they may be able to help and keep you from a long and expensive emergency room wait.   We are glad you are feeling better,  Sallyanne Primas Internal Medicine Inpatient Teaching Service at [facility]   Increase activity slowly   Complete by: As directed        Signed: Kimorah Ridolfi, DO 10/12/2023, 12:01 PM

## 2023-10-14 ENCOUNTER — Telehealth: Payer: Self-pay

## 2023-10-14 NOTE — Transitions of Care (Post Inpatient/ED Visit) (Unsigned)
   10/14/2023  Name: Glen Merritt MRN: [id number] DOB: [birth year]  Today's TOC FU Call Status: Today's TOC FU Call Status:: Unsuccessful Call (1st Attempt) Unsuccessful Call (1st Attempt) Date: 10/14/23  Attempted to reach the patient regarding the most recent Inpatient/ED visit.  Follow Up Plan: Additional outreach attempts will be made to reach the patient to complete the Transitions of Care (Post Inpatient/ED visit) call.   Signature Julian Lemmings, LPN [facility] Nurse Health Advisor Direct Dial  [phone]

## 2023-10-15 NOTE — Transitions of Care (Post Inpatient/ED Visit) (Signed)
   10/15/2023  Name: Glen Merritt MRN: [id number] DOB: [birth year]  Today's TOC FU Call Status: Today's TOC FU Call Status:: Successful TOC FU Call Completed Unsuccessful Call (1st Attempt) Date: 10/14/23 Unsuccessful Call (2nd Attempt) Date: 10/15/23 [facility] FU Call Complete Date: 10/15/23 [name] and Date of Birth confirmed.  Transition Care Management Follow-up Telephone Call Date of Discharge: 10/12/23 Discharge Facility: [facility]) Type of Discharge: Inpatient Admission Primary Inpatient Discharge Diagnosis:: DM How have you been since you were released from the hospital?: Better Any questions or concerns?: No  Items Reviewed: Did you receive and understand the discharge instructions provided?: Yes Medications obtained,verified, and reconciled?: Yes (Medications Reviewed) Any new allergies since your discharge?: No Dietary orders reviewed?: Yes Do you have support at home?: Yes People in Home [RPT]: significant other  Medications Reviewed Today: Medications Reviewed Today     Reviewed by Emmitt Pan, LPN (Licensed Practical Nurse) on 10/15/23 at 1228  Med List Status: <None>   Medication Order Taking? Sig Documenting Provider Last Dose Status Informant  Continuous Glucose Sensor (DEXCOM G7 SENSOR) MISC [id number] Yes Use continuously to measure glucose levels. Trixie File, MD  Active Self, Pharmacy Records  gabapentin  (NEURONTIN ) 300 MG capsule [id number] Yes Take 1 capsule (300 mg total) by mouth in the morning and afternoon. Take 2 capsule (600 mg total) by mouth at bedtime.  Patient taking differently: Take 300 mg by mouth 3 (three) times daily.   Elicia Sharper, DO  Active Self, Pharmacy Records  glucose blood (ACCU-CHEK GUIDE) test strip [id number] Yes Used to check blood sugars four times daily. Kassie Mallick, MD  Active Self, Pharmacy Records  insulin  degludec (TRESIBA  FLEXTOUCH) 100 UNIT/ML FlexTouch Pen [id number] Yes Inject 40 Units into the skin  daily. Rihner, Emilie, DO  Active   insulin  lispro (HUMALOG  KWIKPEN) 100 UNIT/ML KwikPen [id number] Yes Inject 10 Units into the skin 3 (three) times daily. USE AS DIRECTED, INJECT A TOTAL OF 40 UNITS DAILY Rihner, Emilie, DO  Active   Insulin  Pen Needle 32G X 4 MM MISC [id number] Yes Use 4x a day Trixie File, MD  Active Self, Pharmacy Records  Lancets Misc. (ACCU-CHEK MULTICLIX LANCET DEV) KIT [id number] Yes Used to check blood sugars four times daily. Kassie Mallick, MD  Active Self, Pharmacy Records  lisinopril  (ZESTRIL ) 20 MG tablet [id number] Yes Take 1 tablet (20 mg total) by mouth daily. Elicia Sharper, DO  Active Self, Pharmacy Records            Home Care and Equipment/Supplies: Were Home Health Services Ordered?: NA Any new equipment or medical supplies ordered?: NA  Functional Questionnaire: Do you need assistance with bathing/showering or dressing?: No Do you need assistance with meal preparation?: No Do you need assistance with eating?: No Do you have difficulty maintaining continence: No Do you need assistance with getting out of bed/getting out of a chair/moving?: No Do you have difficulty managing or taking your medications?: No  Follow up appointments reviewed: PCP Follow-up appointment confirmed?: Yes Date of PCP follow-up appointment?: 10/22/23 [facility] Follow-up appointment confirmed?: NA Do you need transportation to your follow-up appointment?: No Do you understand care options if your condition(s) worsen?: Yes-patient verbalized understanding    SIGNATURE Pan Emmitt, LPN [facility] Nurse Health Advisor Direct Dial  [phone]

## 2023-10-15 NOTE — Transitions of Care (Post Inpatient/ED Visit) (Signed)
   10/15/2023  Name: Glen Merritt MRN: [id number] DOB: 12-22-2000  Today's TOC FU Call Status: Today's TOC FU Call Status:: Unsuccessful Call (2nd Attempt) Unsuccessful Call (1st Attempt) Date: 10/14/23 Unsuccessful Call (2nd Attempt) Date: 10/15/23  Attempted to reach the patient regarding the most recent Inpatient/ED visit.  Follow Up Plan: Additional outreach attempts will be made to reach the patient to complete the Transitions of Care (Post Inpatient/ED visit) call.   Signature Julian Lemmings, LPN [facility] Nurse Health Advisor Direct Dial  [phone]

## 2023-10-22 ENCOUNTER — Inpatient Hospital Stay: Admitting: Student

## 2023-10-22 ENCOUNTER — Encounter: Payer: Self-pay | Admitting: Dietician

## 2023-10-22 NOTE — Progress Notes (Deleted)
 CC: ***  HPI: [name] is a 23 y.o. male living with a history stated below and presents today for ***. Please see problem based assessment and plan for additional details.  Past Medical History:  Diagnosis Date   DKA (diabetic ketoacidosis) (HCC) 04/17/2022   History of diabetic ketoacidosis 04/26/2022   Hypertension    Ketonuria 09/29/2014   Type 1 diabetes mellitus (HCC) 10/11/2003   Dx 10/2003.  On a pump in the past    Current Outpatient Medications on File Prior to Visit  Medication Sig Dispense Refill   Continuous Glucose Sensor (DEXCOM G7 SENSOR) MISC Use continuously to measure glucose levels. 6 each 3   gabapentin  (NEURONTIN ) 300 MG capsule Take 1 capsule (300 mg total) by mouth in the morning and afternoon. Take 2 capsule (600 mg total) by mouth at bedtime. (Patient taking differently: Take 300 mg by mouth 3 (three) times daily.) 120 capsule 5   glucose blood (ACCU-CHEK GUIDE) test strip Used to check blood sugars four times daily. 200 each 11   insulin  degludec (TRESIBA  FLEXTOUCH) 100 UNIT/ML FlexTouch Pen Inject 40 Units into the skin daily. 3 mL 0   insulin  lispro (HUMALOG  KWIKPEN) 100 UNIT/ML KwikPen Inject 10 Units into the skin 3 (three) times daily. USE AS DIRECTED, INJECT A TOTAL OF 40 UNITS DAILY 9 mL 0   Insulin  Pen Needle 32G X 4 MM MISC Use 4x a day 400 each 3   Lancets Misc. (ACCU-CHEK MULTICLIX LANCET DEV) KIT Used to check blood sugars four times daily. 200 each 11   lisinopril  (ZESTRIL ) 20 MG tablet Take 1 tablet (20 mg total) by mouth daily. 30 tablet 11   No current facility-administered medications on file prior to visit.    Family History  Problem Relation Age of Onset   Heart disease Maternal Grandmother    Heart disease Maternal Grandfather    Cancer Maternal Grandfather    Obesity Mother    Diabetes Mother    Hypertension Mother     Social History   Socioeconomic History   Marital status: Single    Spouse name: Not on file    Number of children: Not on file   Years of education: Not on file   Highest education level: Not on file  Occupational History   Not on file  Tobacco Use   Smoking status: Never   Smokeless tobacco: Never  Vaping Use   Vaping status: Every Day  Substance and Sexual Activity   Alcohol use: Yes    Comment: occ   Drug use: Never   Sexual activity: Not on file  Other Topics Concern   Not on file  Social History Narrative   Not on file   Social Drivers of Health   Financial Resource Strain: Not on file  Food Insecurity: No Food Insecurity (10/12/2023)   Hunger Vital Sign    Worried About Running Out of Food in the Last Year: Never true    Ran Out of Food in the Last Year: Never true  Transportation Needs: No Transportation Needs (10/12/2023)   PRAPARE - Administrator, Civil Service (Medical): No    Lack of Transportation (Non-Medical): No  Physical Activity: Not on file  Stress: Not on file  Social Connections: Not on file  Intimate Partner Violence: Not At Risk (10/12/2023)   Humiliation, Afraid, Rape, and Kick questionnaire    Fear of Current or Ex-Partner: No    Emotionally Abused: No  Physically Abused: No    Sexually Abused: No    Review of Systems: ROS negative except for what is noted on the assessment and plan.  There were no vitals filed for this visit.  Physical Exam  Physical Exam: Constitutional: well-appearing *** sitting in ***, in no acute distress HENT: normocephalic atraumatic, mucous membranes moist Eyes: conjunctiva non-erythematous Cardiovascular: regular rate and rhythm, no m/r/g Pulmonary/Chest: normal work of breathing on room air, lungs clear to auscultation bilaterally Abdominal: soft, non-tender, non-distended MSK: *** Neurological: alert & oriented x 3, 5/5 strength in bilateral upper and lower extremities, normal gait Skin: warm and dry Psych: ***  Assessment & Plan:   Assessment & Plan     No orders of the  defined types were placed in this encounter.  Follow up Hospitalization  Patient was [facility] 10[facility]11[facility]2023 and discharged on 10/12/2023. He was treated for DKA. Treatment for this included IV insulin  and IVF. Telephone follow up was done on 10/14/2023 He reports {excellent/good/fair:19992} compliance with treatment. He reports this condition is {resolved/improved/worsened:23923}.  ----------------------------------------------------------------------------------------- -   No follow-ups on file.  T1DM Last A1c 10.5 in 09/2023, improved from 11.6.  Recent hospitalization for DKA.  Recent travel to [place] with decreased p.o. intake. Follows with [facility] endocrinology but is overdue for follow-up.  Currently taking Tresiba  40 units along with Humalog  10 units 3 times daily AC ***  HTN Status: {statusupdate:33856}. BP today ***. Reports taking lisinopril  20 mg.  Last BMP in 10/12/2023 with K3.4 and normal renal function. ***  Plan -Continue: *** -BMP ***  Anxiety/depression    Patient {GC/GE:3044014::discussed with,seen with} Dr. {WJFZD:[id number]::Tpoopjfd,Z. Hoffman,Winfrey,Narendra,Chun,Chambliss,Lau,Machen}  Glen Merritt, D.O. [facility] Internal Medicine, PGY-3 Clinic Phone: [phone] Date 10/22/2023 Time 11:32 AM

## 2023-11-13 ENCOUNTER — Other Ambulatory Visit: Payer: Self-pay | Admitting: Internal Medicine

## 2023-11-13 DIAGNOSIS — E109 Type 1 diabetes mellitus without complications: Secondary | ICD-10-CM

## 2023-11-14 NOTE — Telephone Encounter (Signed)
 Patient is scheduled for Monday, 11/25/2023 at 3:20 PM.

## 2023-11-25 ENCOUNTER — Ambulatory Visit (INDEPENDENT_AMBULATORY_CARE_PROVIDER_SITE_OTHER): Admitting: Internal Medicine

## 2023-11-25 ENCOUNTER — Encounter: Payer: Self-pay | Admitting: Internal Medicine

## 2023-11-25 VITALS — BP 120/70 | HR 110 | Ht 70.0 in | Wt 149.4 lb

## 2023-11-25 DIAGNOSIS — E109 Type 1 diabetes mellitus without complications: Secondary | ICD-10-CM

## 2023-11-25 DIAGNOSIS — E10649 Type 1 diabetes mellitus with hypoglycemia without coma: Secondary | ICD-10-CM

## 2023-11-25 NOTE — Patient Instructions (Addendum)
 Please look into: - Omnipod 5 - Tandem t:slim x2 - Tandem mobi - twiist These integrate with the Dexcom CGM.  Restart the CGM ASAP.  Please decrease: - Tresiba  36 units daily  Continue: - Humalog   ICR 1:4 Target 130 ISF:  20; max 8-10 correction  Try to stop the trailmix snacks.  Please return in 1 month.

## 2023-11-25 NOTE — Progress Notes (Signed)
 Patient ID: Glen Merritt, male   DOB: 2000/05/13, 23 y.o.   MRN: [id number]  HPI: Glen Merritt is a 23 y.o.-year-old male, returning for follow-up for DM1, diagnosed in 2005, uncontrolled, without long term complications, but with history of DKA and severe hypoglycemia.  He previously saw Dr. Kassie, but last visit with me 1 year and 7 months ago. He is late 25 minutes today for his 20-minute appointment.  Interim history: No increased urination, blurry vision, nausea, chest pain. Since last visit he had 2 more DKA admissions, 01/23/2023 - did not take the insulin  with him at work; and 10/11/2023 - does not know why.  Reviewed HbA1c levels: Lab Results  Component Value Date   HGBA1C 10.5 (A) 09/25/2023   HGBA1C 11.6 (A) 06/14/2023   HGBA1C 11.5 (H) 01/23/2023  11/05/2021: HbA1c 10.1%  He is not on an insulin  pump.  He had this in the past, but he was running track in high school >> inconvenient.  At last visit, he was on: - Tresiba  90 >> 70 units daily - Humalog  -up to 40-50 units a day; ICR 1:4 Correction: 5-10 units (no SSI)  I recommended the following regimen: - Tresiba  70 >> 30 >> 34 >> 40 units daily - Humalog   ICR 1:4 Target 130 ISF:  20; max 8-10 correction  Meter: Accu-Chek guide  He uses the Dexcom CGM - restarted since last visit -not in the last 2 weeks.  He forgot his receiver and we could not analyze the tracings together today: - am: 170-190, 201 >> 160-170 >> 70-90 - 2h after b'fast: n/c - before lunch: n/c >> 120 >> 180-200 (after snacks) - 2h after lunch: n/c - before dinner: n/c >> 150 >> 140-160 - 2h after dinner: n/c - bedtime: 140-150 >> 200 >> 200 - nighttime:n/c Lowest sugar was 52 at night (took insulin  and not eating all the food) >> 80 >> 60; he has hypoglycemia awareness at 80.  He does not have a glucagon  kit at home.  + Last severe hypoglycemia episode in 2025 (syncope at work - very active, did not eat).  Highest sugar was 300 >> 400s >>  300s. + Hypoglycemia admission 02/2022.  + DKA admission in 11/2021, 04/2022, 06/2022, 01/23/2023, 10/11/2023.   - no CKD, last BUN/creatinine:  Lab Results  Component Value Date   BUN 6 10/12/2023   BUN 8 10/12/2023   CREATININE 0.87 10/12/2023   CREATININE 1.00 10/12/2023   Lab Results  Component Value Date   MICRALBCREAT 21 06/14/2023   MICRALBCREAT 7 04/02/2016   MICRALBCREAT 12 11/23/2014  On lisinopril  5 mg daily.  -No HL; last set of lipids: Lab Results  Component Value Date   CHOL 143 04/02/2016   HDL 79 04/02/2016   LDLCALC 39 04/02/2016   TRIG 124 (H) 04/02/2016   CHOLHDL 1.8 04/02/2016   - last eye exam was in 03/2020. No DR reportedly.  - no numbness and tingling in his feet.  Last foot exam 06/14/2023.  He has nerve pain and muscle cramps.  Last TSH: Lab Results  Component Value Date   TSH 1.620 09/25/2023   He also has a history of HTN.  ROS: + see HPI  Past Medical History:  Diagnosis Date   DKA (diabetic ketoacidosis) (HCC) 04/17/2022   History of diabetic ketoacidosis 04/26/2022   Hypertension    Ketonuria 09/29/2014   Type 1 diabetes mellitus (HCC) 10/11/2003   Dx 10/2003.  On a pump in the past  No past surgical history on file. Social History   Socioeconomic History   Marital status: Single    Spouse name: Not on file   Number of children: Not on file   Years of education: Not on file   Highest education level: Not on file  Occupational History   Not on file  Tobacco Use   Smoking status: Never   Smokeless tobacco: Never  Vaping Use   Vaping status: Every Day  Substance and Sexual Activity   Alcohol use: Yes    Comment: occ   Drug use: Never   Sexual activity: Not on file  Other Topics Concern   Not on file  Social History Narrative   Not on file   Social Drivers of Health   Financial Resource Strain: Not on file  Food Insecurity: No Food Insecurity (10/12/2023)   Hunger Vital Sign    Worried About Running Out of Food  in the Last Year: Never true    Ran Out of Food in the Last Year: Never true  Transportation Needs: No Transportation Needs (10/12/2023)   PRAPARE - Administrator, Civil Service (Medical): No    Lack of Transportation (Non-Medical): No  Physical Activity: Not on file  Stress: Not on file  Social Connections: Not on file  Intimate Partner Violence: Not At Risk (10/12/2023)   Humiliation, Afraid, Rape, and Kick questionnaire    Fear of Current or Ex-Partner: No    Emotionally Abused: No    Physically Abused: No    Sexually Abused: No   Current Outpatient Medications on File Prior to Visit  Medication Sig Dispense Refill   Continuous Glucose Sensor (DEXCOM G7 SENSOR) MISC Use continuously to measure glucose levels. 6 each 3   gabapentin  (NEURONTIN ) 300 MG capsule Take 1 capsule (300 mg total) by mouth in the morning and afternoon. Take 2 capsule (600 mg total) by mouth at bedtime. (Patient taking differently: Take 300 mg by mouth 3 (three) times daily.) 120 capsule 5   glucose blood (ACCU-CHEK GUIDE) test strip Used to check blood sugars four times daily. 200 each 11   insulin  degludec (TRESIBA  FLEXTOUCH) 100 UNIT/ML FlexTouch Pen Inject 40 Units into the skin daily. 3 mL 0   insulin  lispro (HUMALOG  KWIKPEN) 100 UNIT/ML KwikPen USE AS DIRECTED, INJECT A TOTAL OF 40 UNITS DAILY 15 mL 0   Insulin  Pen Needle 32G X 4 MM MISC Use 4x a day 400 each 3   Lancets Misc. (ACCU-CHEK MULTICLIX LANCET DEV) KIT Used to check blood sugars four times daily. 200 each 11   lisinopril  (ZESTRIL ) 20 MG tablet Take 1 tablet (20 mg total) by mouth daily. 30 tablet 11   No current facility-administered medications on file prior to visit.   No Known Allergies Family History  Problem Relation Age of Onset   Heart disease Maternal Grandmother    Heart disease Maternal Grandfather    Cancer Maternal Grandfather    Obesity Mother    Diabetes Mother    Hypertension Mother    PE: BP 120/70   Pulse  (!) 110   Ht 5' 10 (1.778 m)   Wt 149 lb 6.4 oz (67.8 kg)   SpO2 97%   BMI 21.44 kg/m  Wt Readings from Last 20 Encounters:  11/25/23 149 lb 6.4 oz (67.8 kg)  10/11/23 141 lb 4.8 oz (64.1 kg)  09/25/23 148 lb 12.8 oz (67.5 kg)  06/14/23 141 lb 6.4 oz (64.1 kg)  03/13/23 151 lb  8 oz (68.7 kg)  01/23/23 137 lb 2 oz (62.2 kg)  07/06/22 155 lb 9.6 oz (70.6 kg)  06/30/22 160 lb (72.6 kg)  04/26/22 154 lb 9.6 oz (70.1 kg)  04/17/22 160 lb (72.6 kg)  02/18/22 160 lb (72.6 kg)  02/08/22 159 lb (72.1 kg)  01/23/21 162 lb 12.8 oz (73.8 kg)  11/02/20 160 lb (72.6 kg)  07/05/20 161 lb 9.6 oz (73.3 kg)  12/03/19 156 lb (70.8 kg)  10/21/19 158 lb (71.7 kg)  07/17/19 153 lb 8 oz (69.6 kg)  05/15/19 156 lb (70.8 kg)  03/13/19 163 lb 6.4 oz (74.1 kg)   Constitutional: normal weight, in NAD Eyes:  EOMI, no exophthalmos ENT: no neck masses, no cervical lymphadenopathy Cardiovascular: Tachycardia, RR, No MRG Respiratory: CTA B Musculoskeletal: no deformities Skin:no rashes Neurological: + tremor with outstretched hands  ASSESSMENT: 1. DM1, uncontrolled, without long-term complications, but with hyperglycemia, DKA, and hypoglycemia  2. H/o hypoglycemia unawareness  PLAN:  1. Patient longstanding, uncontrolled, type 1 diabetes, with multiple DKA episodes.  He returns after another long absence of 1 year and 7 months.  We discussed that this is unacceptable commitment to his diabetes control.  Indeed, he with in the emergency room with DKA 01/23/2023 and 10/11/2023.  At the last admission, he was weak, not able to eat after an episode of abdominal pain for which he was in the emergency room previously. - At our last visit he was off his CGM and I strongly advised him to start back on it.  We also discussed about getting back on an insulin  pump he was on the Cosmo pump in the past but we reviewed the newer pumps on the market.  I strongly advised him to look into these.  He was very interested in  restarting a pump.  In the meantime, I advised him to schedule an appointment with the diabetes educator and also to increase the Tresiba  dose slightly.  We did not change his Humalog  doses but we reviewed how to calculate the doses based on ICR.  I also advised him to change the pen needles, as they were very long, 8 mm >> changed to 4 mm needles. -At today's visit, he forgot his CGM receiver and we could not analyze the tracings.  His says he is off the sensor for the last 2 weeks but plans to restart.  Per his recall, sugars are quite low in the morning, between 70s and 90s, but they increase significantly before lunch after snacking in the morning on trail mix.  We discussed about ideally stopping this.  Sugars do improve some before dinner but not completely.  They increase again after dinner but not excessively.  For now, aside from stopping the trail mix snack, I advised him to decrease the Tresiba  dose since he is dropping more than 100 points from evening to morning.  I again advised him to look into pounds.  He did not do so yet. - I suggested to: Patient Instructions  Please look into: - Omnipod 5 - Tandem t:slim x2 - Tandem mobi - twiist These integrate with the Dexcom CGM.  Restart the CGM ASAP.  Please decrease: - Tresiba  36 units daily  Continue: - Humalog   ICR 1:4 Target 130 ISF:  20; max 8-10 correction  Try to stop the trailmix snacks.  Please return in 1 month.  - we will check his HbA1c at next visit - advised to check sugars at different times of the day -  4x a day, rotating check times - advised for yearly eye exams >> he is not UTD - we we will check a lipid panel at next visit  - return to clinic in 1 month -I did advise him that I would not be able to help him if he does not return for this appointment  2.  H/o Hypoglycemia unawareness - We discussed that he absolutely needs to stay on the CGM to alert him whenever the sugar starts to drop  -We also  previously discussed that hypoglycemia unawareness is corrected by avoiding hypoglycemia completely for a period of at least 3 weeks - he did start to feel sugars in the 80, thankfully  Lela Fendt, MD PhD [facility] Endocrinology

## 2023-12-16 ENCOUNTER — Other Ambulatory Visit: Payer: Self-pay | Admitting: Internal Medicine

## 2023-12-16 DIAGNOSIS — E109 Type 1 diabetes mellitus without complications: Secondary | ICD-10-CM

## 2023-12-18 ENCOUNTER — Other Ambulatory Visit: Payer: Self-pay

## 2023-12-18 ENCOUNTER — Telehealth: Payer: Self-pay | Admitting: Internal Medicine

## 2023-12-18 NOTE — Telephone Encounter (Signed)
 I called and spoke with the patient to advise him that the rx was sent on 12/16/23 and it went through at 3:49 pm.

## 2023-12-18 NOTE — Telephone Encounter (Signed)
 MEDICATION: Insulin  lispro (HUMALOG  KWIKPEN) 100 UNIT/ML KwikPen   PHARMACY:   [id number] - [place], [place] - [address] AT [facility] [address] [place] [address] Phone: [phone]  Fax: [phone]    HAS THE PATIENT CONTACTED THEIR PHARMACY?  Yes  LAST REFILL:  @@LASTREFILL @  IS THIS A 90 DAY SUPPLY : Yes  IS PATIENT OUT OF MEDICATION: Yes,for 2 days  IF NOT; HOW MUCH IS LEFT:   LAST APPOINTMENT DATE: @12 /15/2025  NEXT APPOINTMENT DATE:@12 /26/2025  DO WE HAVE YOUR PERMISSION TO LEAVE A DETAILED MESSAGE?: Yes  OTHER COMMENTS: Patient stated that he has been without the medication for 2 days and it is needing a PA.   **Let patient know to contact pharmacy at the end of the day to make sure medication is ready. **  ** Please notify patient to allow 48-72 hours to process**  **Encourage patient to contact the pharmacy for refills or they can request refills through [facility]**

## 2023-12-27 ENCOUNTER — Ambulatory Visit: Admitting: Internal Medicine

## 2024-01-07 ENCOUNTER — Ambulatory Visit: Admitting: Internal Medicine

## 2024-01-07 ENCOUNTER — Encounter: Payer: Self-pay | Admitting: Internal Medicine

## 2024-01-07 NOTE — Progress Notes (Deleted)
 Patient ID: Glen Merritt, male   DOB: 26-Feb-2000, 24 y.o.   MRN: [id number]  HPI: Glen Merritt is a 24 y.o.-year-old male, returning for follow-up for DM1, diagnosed in 2005, uncontrolled, without long term complications, but with history of DKA and severe hypoglycemia.  He previously saw Dr. Kassie, but last visit with me 1 year and 7 months ago.  At last visit he was late 18 minutes for his 20-minute appointment.  Interim history: No increased urination, blurry vision, nausea, chest pain. Before last visit he had 2 more DKA admissions, 01/23/2023 - did not take the insulin  with him at work; and 10/11/2023 -did not know why.  Reviewed HbA1c levels: Lab Results  Component Value Date   HGBA1C 10.5 (A) 09/25/2023   HGBA1C 11.6 (A) 06/14/2023   HGBA1C 11.5 (H) 01/23/2023  11/05/2021: HbA1c 10.1%  He is not on an insulin  pump.  He had this in the past, but he was running track in high school >> inconvenient.  At last visit, he was on: - Tresiba  90 >> 70 units daily - Humalog  -up to 40-50 units a day; ICR 1:4 Correction: 5-10 units (no SSI)  I recommended the following regimen: - Tresiba  70 >> 30 >> 34 >> 40 >> 36 units daily - Humalog   ICR 1:4 Target 130 ISF:  20; max 8-10 correction  Meter: Accu-Chek guide  He uses the Dexcom CGM - he was off the CGM for 2 weeks, and he also forgot his receiver and we could not analyze the tracings.  He does bring the receiver today:  Previously: - am: 170-190, 201 >> 160-170 >> 70-90 - 2h after b'fast: n/c - before lunch: n/c >> 120 >> 180-200 (after snacks) - 2h after lunch: n/c - before dinner: n/c >> 150 >> 140-160 - 2h after dinner: n/c - bedtime: 140-150 >> 200 >> 200 - nighttime:n/c Lowest sugar was 52 at night (took insulin  and not eating all the food) >> 80 >> 60; he has hypoglycemia awareness at 80.  He does not have a glucagon  kit at home.  + Last severe hypoglycemia episode in 2021 (syncope at work - very active, did not eat).   Highest sugar was 300 >> 400s >> 300s. + Hypoglycemia admission 02/2022.  + DKA admission in 11/2021, 04/2022, 06/2022, 01/23/2023, 10/11/2023.   - no CKD, last BUN/creatinine:  Lab Results  Component Value Date   BUN 6 10/12/2023   BUN 8 10/12/2023   CREATININE 0.87 10/12/2023   CREATININE 1.00 10/12/2023   Lab Results  Component Value Date   MICRALBCREAT 21 06/14/2023   MICRALBCREAT 7 04/02/2016   MICRALBCREAT 12 11/23/2014  On lisinopril  5 mg daily.  -No HL; last set of lipids: Lab Results  Component Value Date   CHOL 143 04/02/2016   HDL 79 04/02/2016   LDLCALC 39 04/02/2016   TRIG 124 (H) 04/02/2016   CHOLHDL 1.8 04/02/2016   - last eye exam was in 03/2020. No DR reportedly.  - no numbness and tingling in his feet.  Last foot exam 06/14/2023.  He has nerve pain and muscle cramps.  Last TSH: Lab Results  Component Value Date   TSH 1.620 09/25/2023   He also has a history of HTN.  At our last visit, did advise him that I would not be able to help him if he does not return for his next appointment.  ROS: + see HPI  Past Medical History:  Diagnosis Date   DKA (diabetic ketoacidosis) (  HCC) 04/17/2022   History of diabetic ketoacidosis 04/26/2022   Hypertension    Ketonuria 09/29/2014   Type 1 diabetes mellitus (HCC) 10/11/2003   Dx 10/2003.  On a pump in the past   No past surgical history on file. Social History   Socioeconomic History   Marital status: Single    Spouse name: Not on file   Number of children: Not on file   Years of education: Not on file   Highest education level: Not on file  Occupational History   Not on file  Tobacco Use   Smoking status: Never   Smokeless tobacco: Never  Vaping Use   Vaping status: Every Day  Substance and Sexual Activity   Alcohol use: Yes    Comment: occ   Drug use: Never   Sexual activity: Not on file  Other Topics Concern   Not on file  Social History Narrative   Not on file   Social Drivers of  Health   Tobacco Use: Low Risk (11/25/2023)   Patient History    Smoking Tobacco Use: Never    Smokeless Tobacco Use: Never    Passive Exposure: Not on file  Financial Resource Strain: Not on file  Food Insecurity: No Food Insecurity (10/12/2023)   Epic    Worried About Programme Researcher, Broadcasting/film/video in the Last Year: Never true    Ran Out of Food in the Last Year: Never true  Transportation Needs: No Transportation Needs (10/12/2023)   Epic    Lack of Transportation (Medical): No    Lack of Transportation (Non-Medical): No  Physical Activity: Not on file  Stress: Not on file  Social Connections: Not on file  Intimate Partner Violence: Not At Risk (10/12/2023)   Epic    Fear of Current or Ex-Partner: No    Emotionally Abused: No    Physically Abused: No    Sexually Abused: No  Depression (PHQ2-9): Medium Risk (09/25/2023)   Depression (PHQ2-9)    PHQ-2 Score: 6  Alcohol Screen: Not on file  Housing: Low Risk (10/12/2023)   Epic    Unable to Pay for Housing in the Last Year: No    Number of Times Moved in the Last Year: 0    Homeless in the Last Year: No  Utilities: Not At Risk (10/12/2023)   Epic    Threatened with loss of utilities: No  Health Literacy: Not on file   Current Outpatient Medications on File Prior to Visit  Medication Sig Dispense Refill   Continuous Glucose Sensor (DEXCOM G7 SENSOR) MISC Use continuously to measure glucose levels. 6 each 3   gabapentin  (NEURONTIN ) 300 MG capsule Take 1 capsule (300 mg total) by mouth in the morning and afternoon. Take 2 capsule (600 mg total) by mouth at bedtime. (Patient taking differently: Take 300 mg by mouth 3 (three) times daily.) 120 capsule 5   glucose blood (ACCU-CHEK GUIDE) test strip Used to check blood sugars four times daily. 200 each 11   insulin  degludec (TRESIBA  FLEXTOUCH) 100 UNIT/ML FlexTouch Pen Inject 40 Units into the skin daily. 3 mL 0   insulin  lispro (HUMALOG  KWIKPEN) 100 UNIT/ML KwikPen USE AS DIRECTED, INJECT  A TOTAL OF 40 UNITS DAILY 15 mL 1   Insulin  Pen Needle 32G X 4 MM MISC Use 4x a day 400 each 3   Lancets Misc. (ACCU-CHEK MULTICLIX LANCET DEV) KIT Used to check blood sugars four times daily. 200 each 11   lisinopril  (ZESTRIL ) 20 MG tablet  Take 1 tablet (20 mg total) by mouth daily. 30 tablet 11   No current facility-administered medications on file prior to visit.   No Known Allergies Family History  Problem Relation Age of Onset   Heart disease Maternal Grandmother    Heart disease Maternal Grandfather    Cancer Maternal Grandfather    Obesity Mother    Diabetes Mother    Hypertension Mother    PE: There were no vitals taken for this visit. Wt Readings from Last 20 Encounters:  11/25/23 149 lb 6.4 oz (67.8 kg)  10/11/23 141 lb 4.8 oz (64.1 kg)  09/25/23 148 lb 12.8 oz (67.5 kg)  06/14/23 141 lb 6.4 oz (64.1 kg)  03/13/23 151 lb 8 oz (68.7 kg)  01/23/23 137 lb 2 oz (62.2 kg)  07/06/22 155 lb 9.6 oz (70.6 kg)  06/30/22 160 lb (72.6 kg)  04/26/22 154 lb 9.6 oz (70.1 kg)  04/17/22 160 lb (72.6 kg)  02/18/22 160 lb (72.6 kg)  02/08/22 159 lb (72.1 kg)  01/23/21 162 lb 12.8 oz (73.8 kg)  11/02/20 160 lb (72.6 kg)  07/05/20 161 lb 9.6 oz (73.3 kg)  12/03/19 156 lb (70.8 kg)  10/21/19 158 lb (71.7 kg)  07/17/19 153 lb 8 oz (69.6 kg)  05/15/19 156 lb (70.8 kg)  03/13/19 163 lb 6.4 oz (74.1 kg)   Constitutional: normal weight, in NAD Eyes:  EOMI, no exophthalmos ENT: no neck masses, no cervical lymphadenopathy Cardiovascular: Tachycardia, RR, No MRG Respiratory: CTA B Musculoskeletal: no deformities Skin:no rashes Neurological: + tremor with outstretched hands  ASSESSMENT: 1. DM1, uncontrolled, without long-term complications, but with hyperglycemia, DKA, and hypoglycemia  2. H/o hypoglycemia unawareness  PLAN:  1. Patient   longstanding, uncontrolled, type 1 diabetes, with multiple DKA episodes.  He returns after another long absence of 1 year and 7 months.  We  discussed that this is unacceptable commitment to his diabetes control.  Indeed, he with in the emergency room with DKA 01/23/2023 and 10/11/2023.  At the last admission, he was weak, not able to eat after an episode of abdominal pain for which he was in the emergency room previously. - At our last visit he was off his CGM and I strongly advised him to start back on it.  We also discussed about getting back on an insulin  pump he was on the Cosmo pump in the past but we reviewed the newer pumps on the market.  I strongly advised him to look into these.  He was very interested in restarting a pump.  In the meantime, I advised him to schedule an appointment with the diabetes educator and also to increase the Tresiba  dose slightly.  We did not change his Humalog  doses but we reviewed how to calculate the doses based on ICR.  I also advised him to change the pen needles, as they were very long, 8 mm >> changed to 4 mm needles. -At today's visit, he forgot his CGM receiver and we could not analyze the tracings.  His says he is off the sensor for the last 2 weeks but plans to restart.  Per his recall, sugars are quite low in the morning, between 70s and 90s, but they increase significantly before lunch after snacking in the morning on trail mix.  We discussed about ideally stopping this.  Sugars do improve some before dinner but not completely.  They increase again after dinner but not excessively.  For now, aside from stopping the trail mix snack,  I advised him to decrease the Tresiba  dose since he is dropping more than 100 points from evening to morning.  I again advised him to look into pounds.  He did not do so yet. - I suggested to: Patient Instructions  Please look into: - Omnipod 5 - Tandem t:slim x2 - Tandem mobi - twiist These integrate with the Dexcom CGM.  Restart the CGM ASAP.  Please continue: - Tresiba  36 units daily - Humalog   ICR 1:4 Target 130 ISF:  20; max 8-10 correction  Try to stop  the trailmix snacks.  Please return in 1.5 month.  - we checked his HbA1c: 7%  - advised to check sugars at different times of the day - 4x a day, rotating check times - advised for yearly eye exams >> he is UTD - return to clinic in 1.5 months  2.  H/o Hypoglycemia unawareness - At last visit I strongly suggested to stay on the CGM to alert him whenever the sugar started to drop - We previously discussed that for hypoglycemia unawareness is corrected by avoiding hypoglycemia completely for a period of at least 3 weeks - he did start to feel sugars in the 80s   Lela Fendt, MD PhD [facility] Endocrinology

## 2024-02-07 ENCOUNTER — Other Ambulatory Visit: Payer: Self-pay | Admitting: Internal Medicine

## 2024-02-07 DIAGNOSIS — E109 Type 1 diabetes mellitus without complications: Secondary | ICD-10-CM
